# Patient Record
Sex: Female | Born: 1985 | Race: White | Hispanic: No | Marital: Married | Smoking: Never smoker
Health system: Southern US, Community
[De-identification: ages and names within clinical notes are randomized; demographics above are authoritative.]

## PROBLEM LIST (undated history)

## (undated) DIAGNOSIS — G43909 Migraine, unspecified, not intractable, without status migrainosus: Secondary | ICD-10-CM

## (undated) HISTORY — PX: UPPER GI ENDOSCOPY: SHX6162

## (undated) HISTORY — PX: TONSILLECTOMY: SUR1361

## (undated) HISTORY — PX: APPENDECTOMY: SHX54

## (undated) HISTORY — DX: Migraine, unspecified, not intractable, without status migrainosus: G43.909

---
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
## 2007-06-17 ENCOUNTER — Ambulatory Visit: Payer: Self-pay | Admitting: Family Medicine

## 2008-08-07 IMAGING — RF DG UGI W/O KUB
1 series · 9 of 9 positions shown · non-contrast
Comparison: none

REASON FOR EXAM: Severe GERD
COMMENTS:

PROCEDURE:     FL  - FL UPPER GI  - June 17, 2007  [DATE]
RESULT:     Esophageal and gastric mucosal pattern and peristaltic activity
and contour are normal. The duodenal bulb is normal.

[Series 1: run · 9 of 9 slices shown]
[im 1/9]
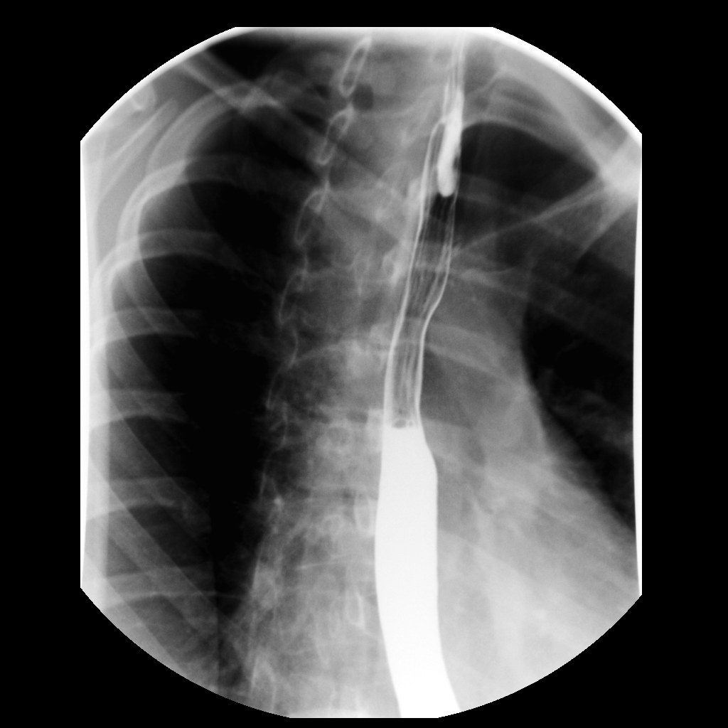
[im 2/9]
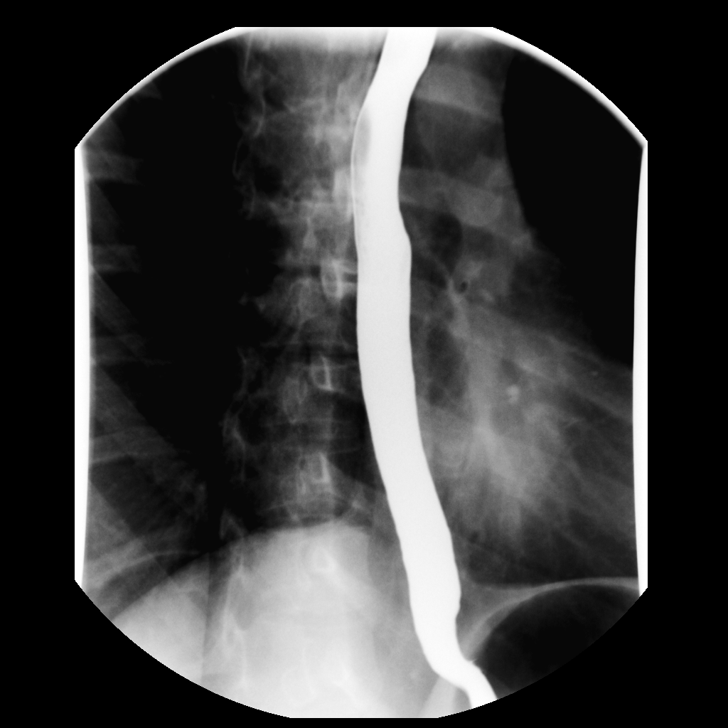
[im 3/9]
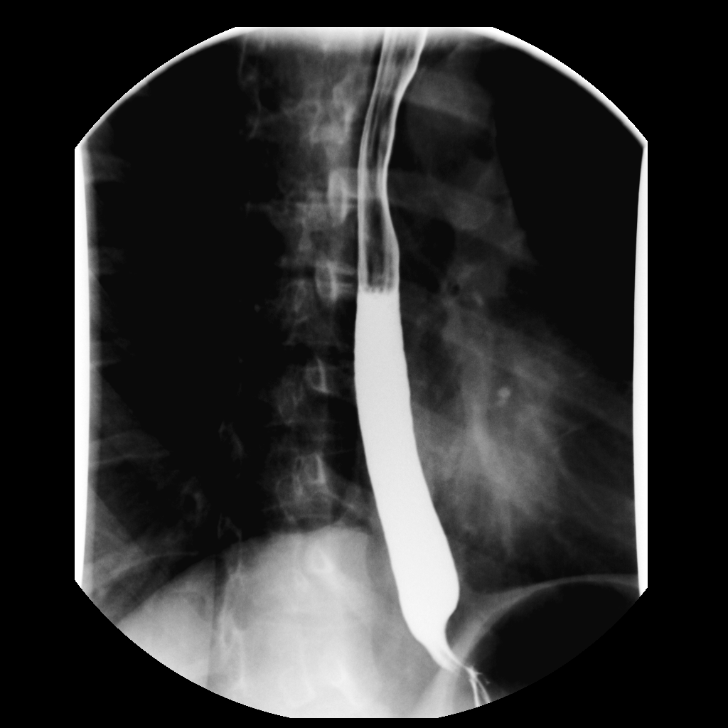
[im 4/9]
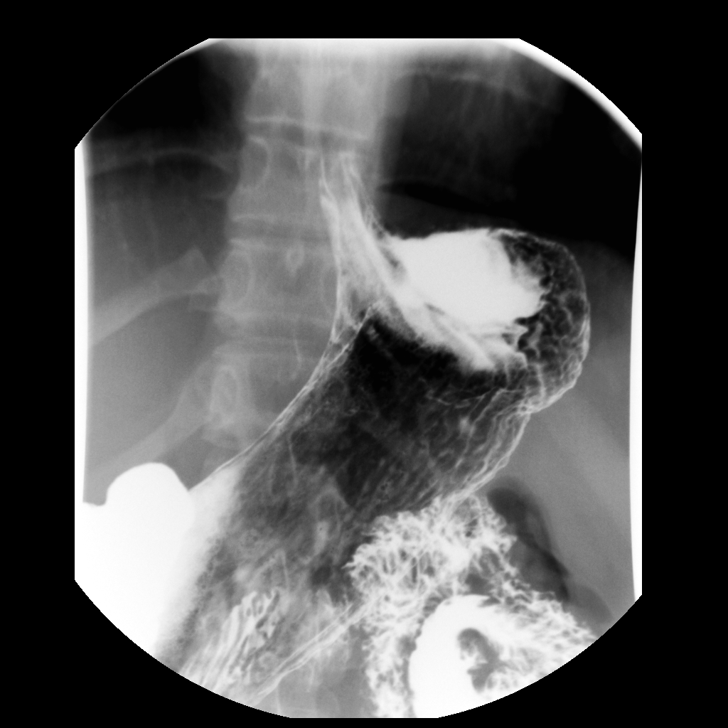
[im 5/9]
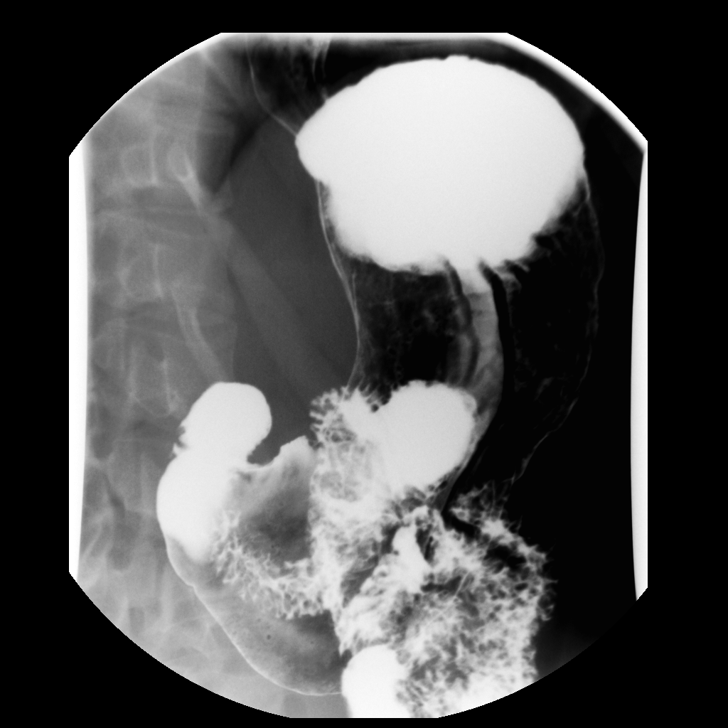
[im 6/9]
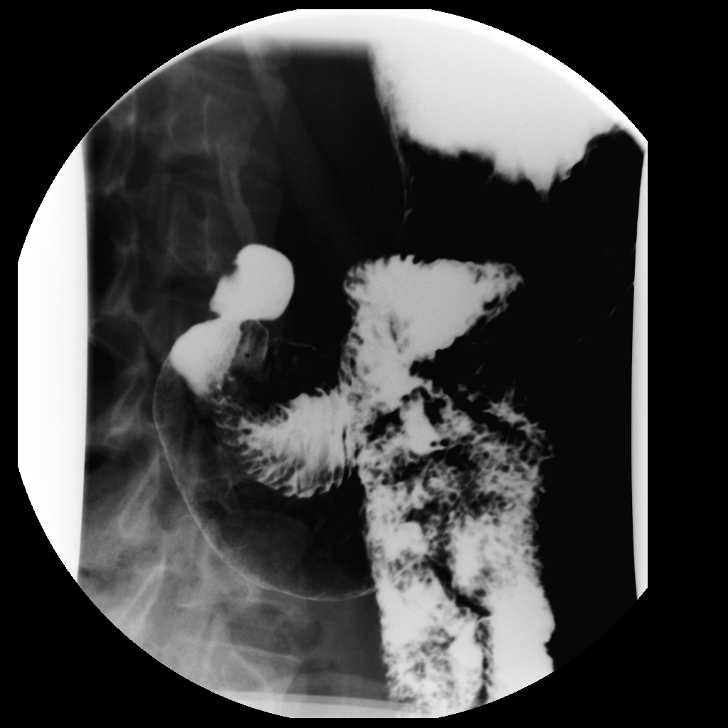
[im 7/9]
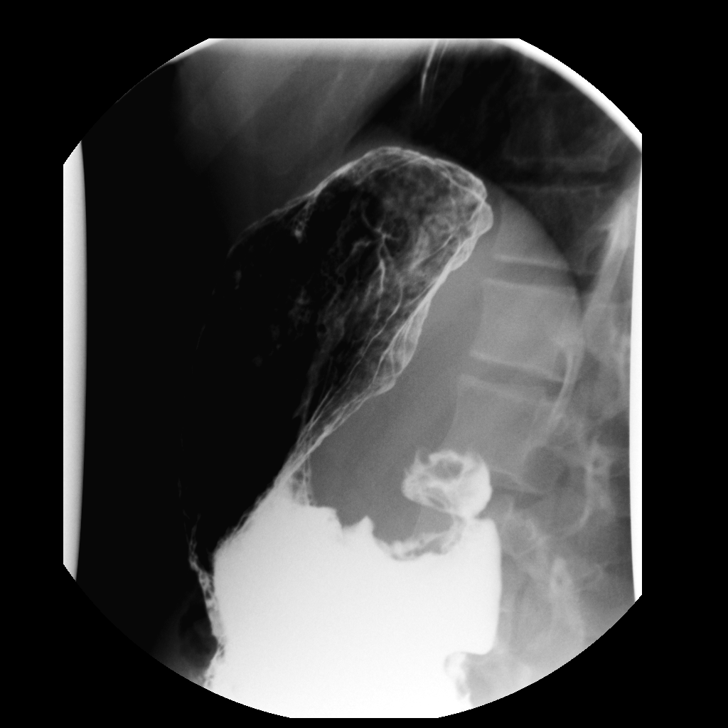
[im 8/9]
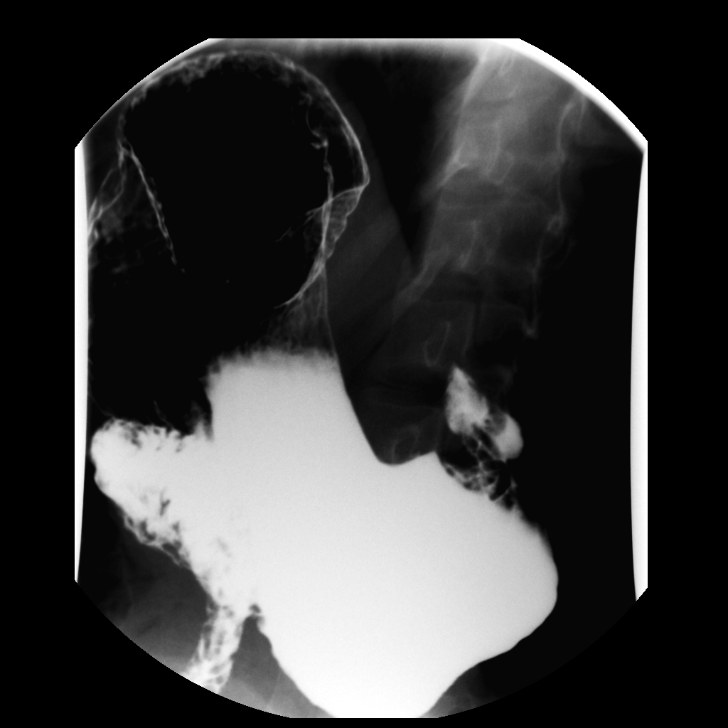
[im 9/9]
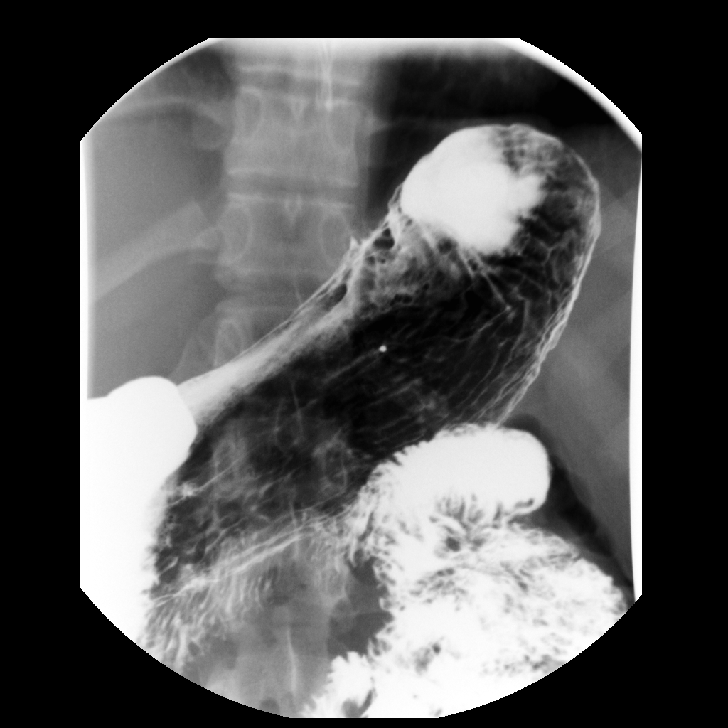

[9 of 9 positions shown; findings below may reference images not displayed]

IMPRESSION: 1)Negative exam.

## 2011-10-10 ENCOUNTER — Other Ambulatory Visit: Payer: Self-pay | Admitting: Internal Medicine

## 2011-10-10 ENCOUNTER — Other Ambulatory Visit: Payer: Self-pay | Admitting: Gastroenterology

## 2011-10-10 DIAGNOSIS — R110 Nausea: Secondary | ICD-10-CM

## 2011-10-10 DIAGNOSIS — R1013 Epigastric pain: Secondary | ICD-10-CM

## 2011-10-16 ENCOUNTER — Ambulatory Visit
Admission: RE | Admit: 2011-10-16 | Discharge: 2011-10-16 | Disposition: A | Discharge status: Home / Self Care | Payer: 59 | Source: Ambulatory Visit | Attending: Gastroenterology | Admitting: Gastroenterology

## 2011-10-16 DIAGNOSIS — R110 Nausea: Secondary | ICD-10-CM

## 2011-10-16 DIAGNOSIS — R1013 Epigastric pain: Secondary | ICD-10-CM

## 2011-11-27 HISTORY — PX: OVARIAN CYST REMOVAL: SHX89

## 2012-12-06 IMAGING — US US ABDOMEN COMPLETE
1 series · 14 of 25 positions shown · non-contrast
Comparison: None.

CLINICAL DATA: Epigastric pain and nausea

ABDOMINAL ULTRASOUND COMPLETE

[Series 1: us abdomen complete · 0.21mm/px · 14 of 64 slices shown]
[im 1/64]
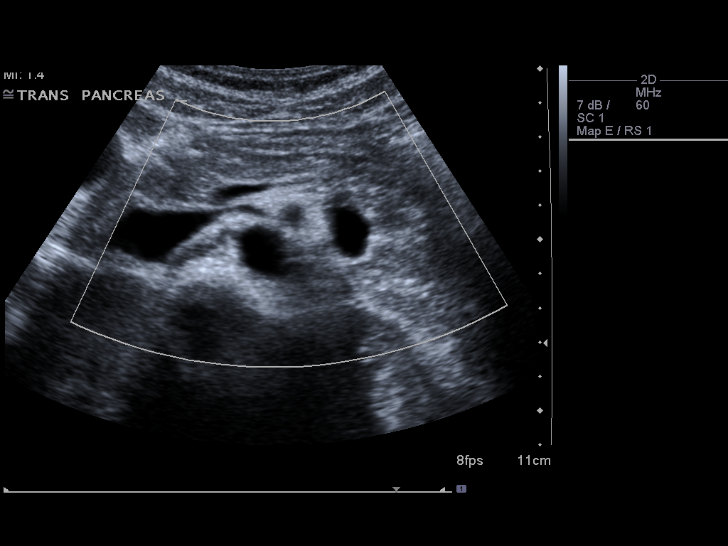
[im 6/64]
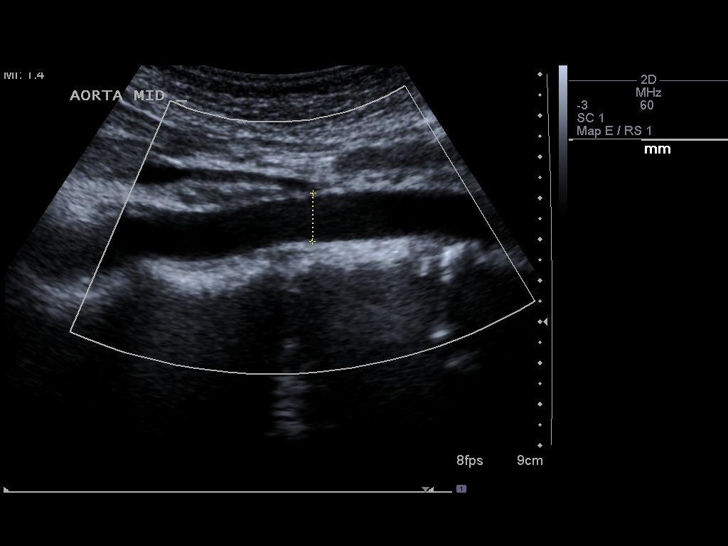
[im 11/64]
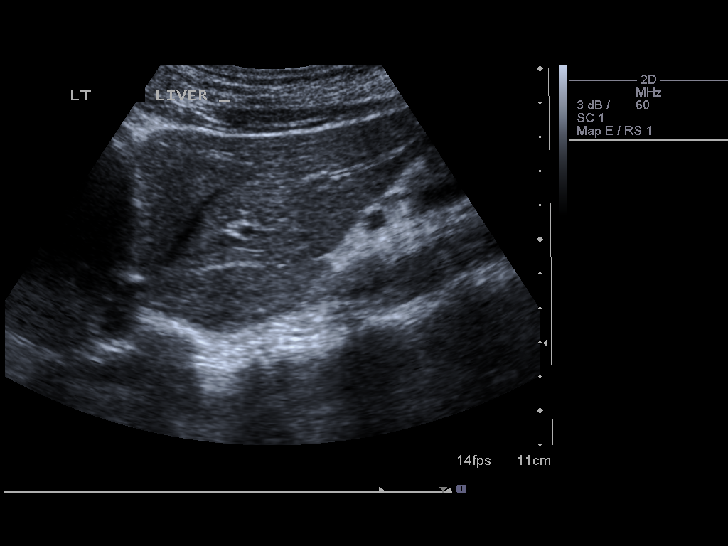
[im 16/64]
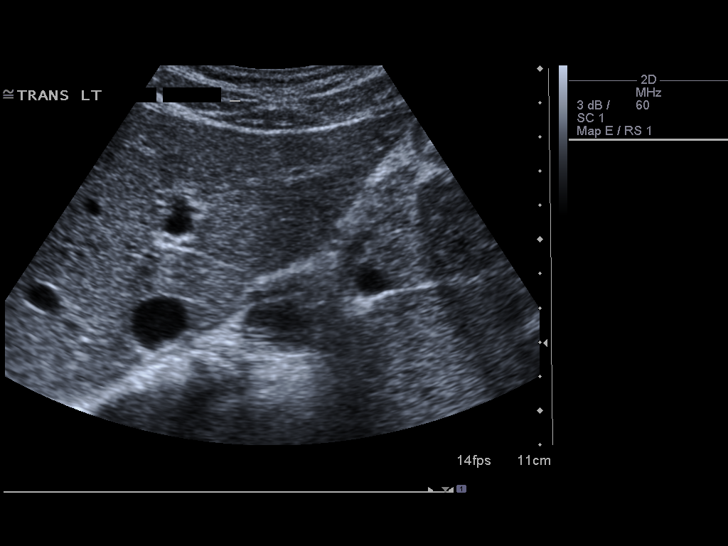
[im 22/64]
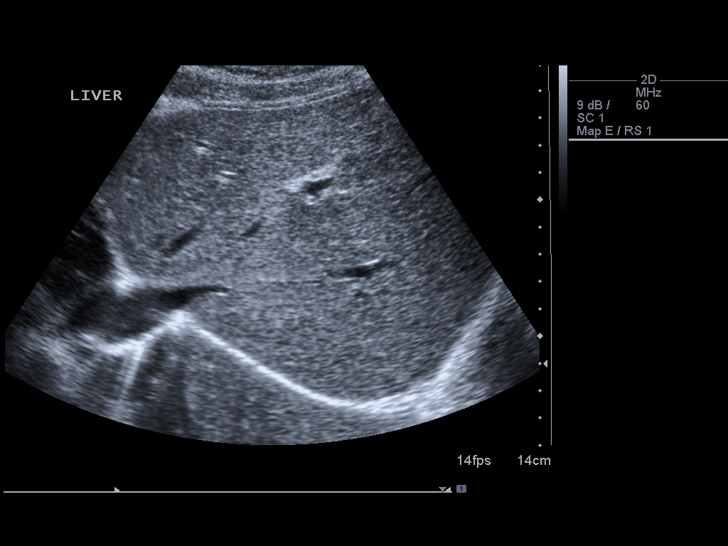
[im 24/64]
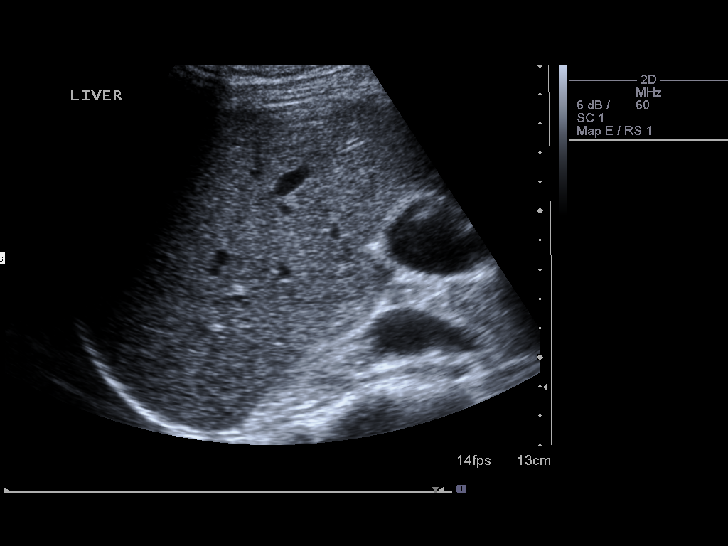
[im 29/64]
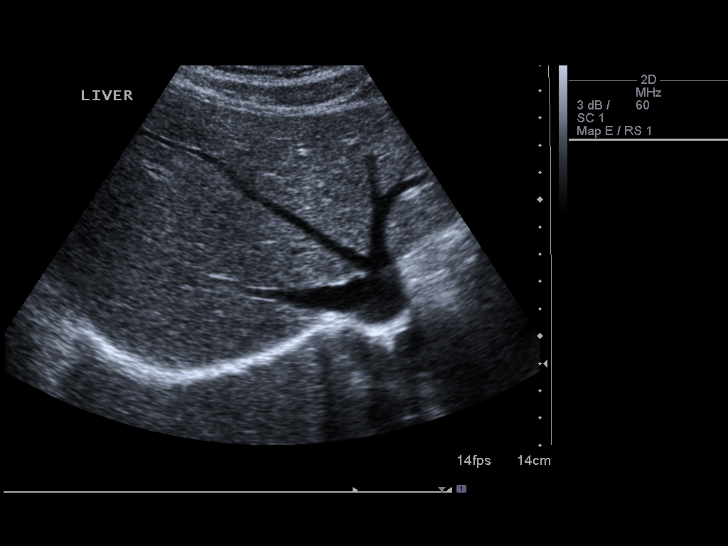
[im 35/64]
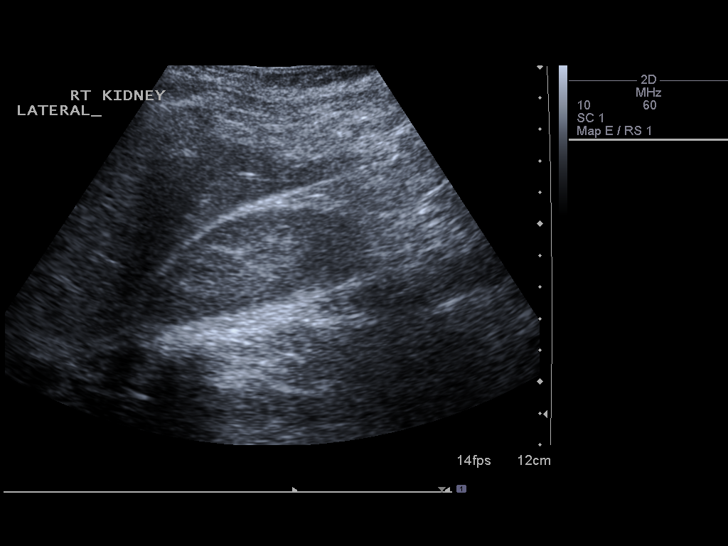
[im 40/64]
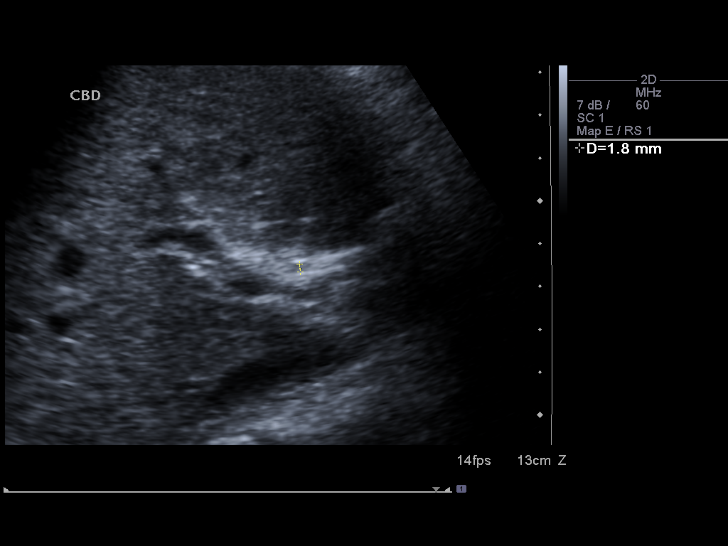
[im 43/64]
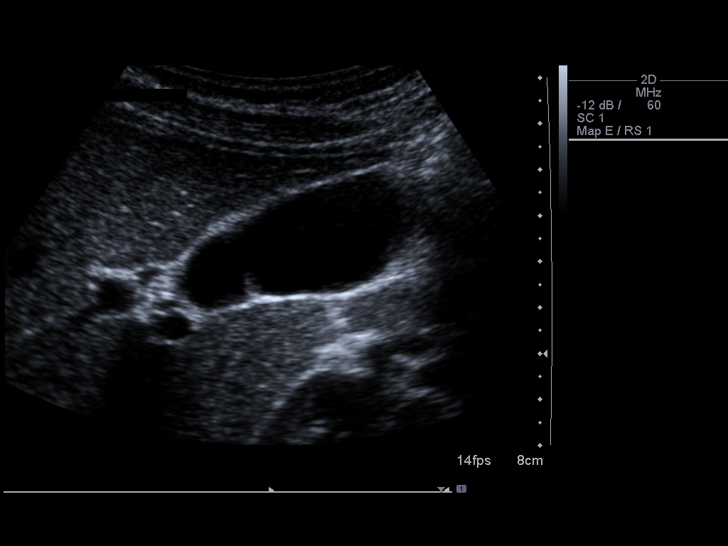
[im 48/64]
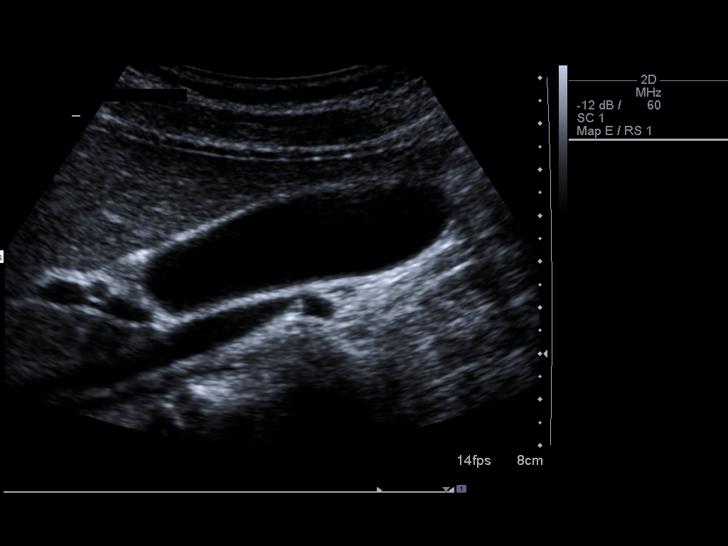
[im 53/64]
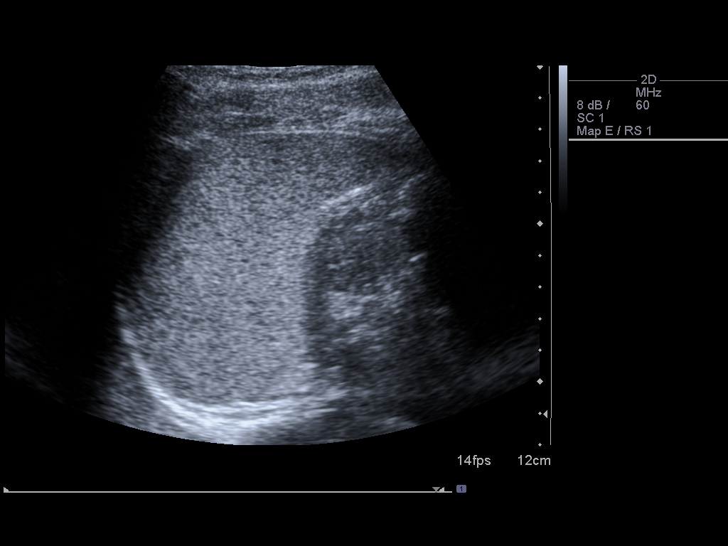
[im 58/64]
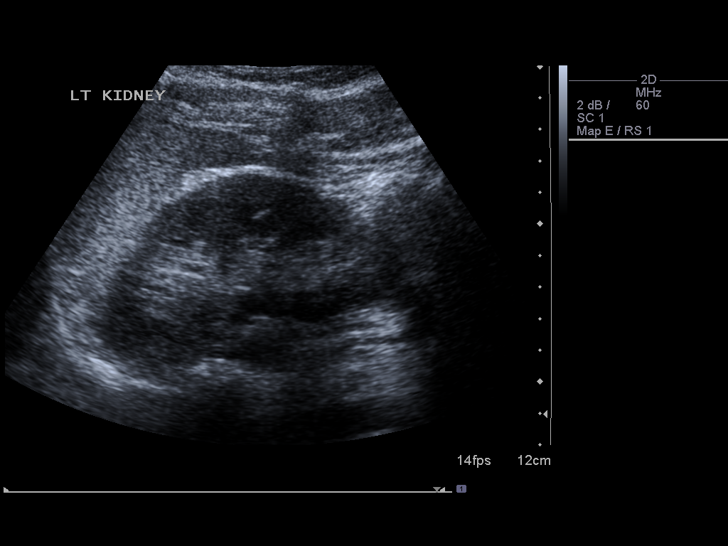
[im 64/64]
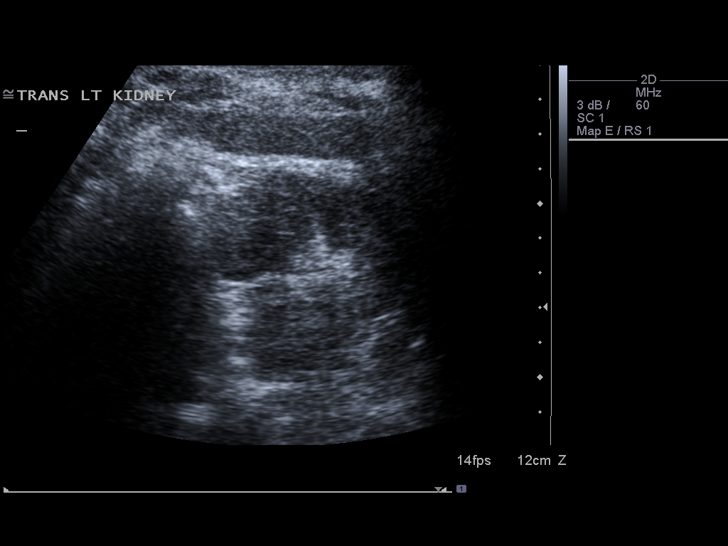

[14 of 25 positions shown; findings below may reference images not displayed]

FINDINGS: Gallbladder:  No gallstones, gallbladder wall thickening, or
pericholecystic fluid.

Common Bile Duct:  Within normal limits in caliber.

Liver: No focal mass lesion identified.  Within normal limits in
parenchymal echogenicity.

IVC:  Appears normal.

Pancreas:  No abnormality identified.

Spleen:  Within normal limits in size and echotexture.

Right kidney:  Normal in size and parenchymal echogenicity.  No
evidence of mass or hydronephrosis.

Left kidney:  Normal in size and parenchymal echogenicity.  No
evidence of mass or hydronephrosis.

Abdominal Aorta:  No aneurysm identified.
IMPRESSION: Negative abdominal ultrasound.

## 2013-11-13 LAB — BASIC METABOLIC PANEL
BUN: 8 mg/dL (ref 4–21)
Creatinine: 0.9 mg/dL (ref 0.5–1.1)
Glucose: 83 mg/dL
Potassium: 5 mmol/L (ref 3.4–5.3)
Sodium: 140 mmol/L (ref 137–147)

## 2013-11-13 LAB — HEPATIC FUNCTION PANEL
ALT: 51 U/L — AB (ref 7–35)
AST: 47 U/L — AB (ref 13–35)
Alkaline Phosphatase: 71 U/L (ref 25–125)
Bilirubin, Total: 0.2 mg/dL

## 2013-11-13 LAB — CBC AND DIFFERENTIAL
HCT: 41 % (ref 36–46)
Hemoglobin: 13.8 g/dL (ref 12.0–16.0)
Neutrophils Absolute: 3 /uL
Platelets: 223 10*3/uL (ref 150–399)
WBC: 6.3 10^3/mL

## 2015-07-06 ENCOUNTER — Other Ambulatory Visit: Payer: Self-pay | Admitting: Family Medicine

## 2016-02-06 ENCOUNTER — Other Ambulatory Visit: Payer: Self-pay | Admitting: Family Medicine

## 2016-02-25 ENCOUNTER — Other Ambulatory Visit: Payer: Self-pay | Admitting: Family Medicine

## 2016-03-05 DIAGNOSIS — N83209 Unspecified ovarian cyst, unspecified side: Secondary | ICD-10-CM | POA: Insufficient documentation

## 2016-03-05 DIAGNOSIS — G43909 Migraine, unspecified, not intractable, without status migrainosus: Secondary | ICD-10-CM | POA: Insufficient documentation

## 2016-03-05 DIAGNOSIS — K589 Irritable bowel syndrome without diarrhea: Secondary | ICD-10-CM | POA: Insufficient documentation

## 2016-03-05 DIAGNOSIS — K219 Gastro-esophageal reflux disease without esophagitis: Secondary | ICD-10-CM | POA: Insufficient documentation

## 2016-03-05 DIAGNOSIS — J45990 Exercise induced bronchospasm: Secondary | ICD-10-CM | POA: Insufficient documentation

## 2016-03-05 DIAGNOSIS — J309 Allergic rhinitis, unspecified: Secondary | ICD-10-CM | POA: Insufficient documentation

## 2016-03-12 ENCOUNTER — Ambulatory Visit: Payer: Self-pay | Admitting: Family Medicine

## 2016-03-13 ENCOUNTER — Ambulatory Visit (INDEPENDENT_AMBULATORY_CARE_PROVIDER_SITE_OTHER): Payer: BLUE CROSS/BLUE SHIELD | Admitting: Family Medicine

## 2016-03-13 VITALS — BP 122/74 | HR 64 | Temp 97.7°F | Resp 16 | Wt 157.0 lb

## 2016-03-13 DIAGNOSIS — K219 Gastro-esophageal reflux disease without esophagitis: Secondary | ICD-10-CM

## 2016-03-13 DIAGNOSIS — F419 Anxiety disorder, unspecified: Secondary | ICD-10-CM

## 2016-03-13 NOTE — Progress Notes (Signed)
Patient ID: Jessica Santana, female   DOB: [birth year], 30 y.o.   MRN: [id number]   Jessica BakesKathryn E Tarlton  MRN: [id number] DOB: [birth year]  Subjective:  HPI   The patient is a [age] year old female who presents for follow up to obtain refill on medications.  He main chronic diagnosis include migraines and GERD.   Her migraines had been doing well on preventative medication and on Botox.  She had an episode that was thought to be an allergic reaction.  She had some testing done in [place] and was told to stop several of her medications.  According to the allergist and immunologist she was allergic to the medicines.  The patient has an appointment with the allergist here in town on Friday.  She states that her GERD is doing well on the Dexilant.  She states that she occasionally has break through pain but not often. Patient is engaged to be married to a man in [place] where she now lives.   Patient Active Problem List   Diagnosis Date Noted  . Allergic rhinitis 03/05/2016  . Asthma, exercise induced 03/05/2016  . Acid reflux 03/05/2016  . 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 Headache, migraine 03/05/2016  . Cyst of ovary 03/05/2016    No past medical history on file.  Social History   Social History  . Marital Status: Single    Spouse Name: N/A  . Number of Children: N/A  . Years of Education: N/A   Occupational History  . Not on file.   Social History Main Topics  . Smoking status: Never Smoker   . Smokeless tobacco: Never Used  . Alcohol Use: No  . Drug Use: No  . Sexual Activity: Not on file   Other Topics Concern  . Not on file   Social History Narrative  . No narrative on file    Outpatient Prescriptions Prior to Visit  Medication Sig Dispense Refill  . DEXILANT 60 MG capsule TAKE 1 CAPSULE TWICE DAILY 180 capsule 1  . DULoxetine (CYMBALTA) 60 MG capsule TAKE 1 CAPSULE EVERY DAY 90 capsule 1  . levocetirizine (XYZAL) 5 MG tablet TAKE ONE TABLET EVERY DAY 90 tablet 1  .  tiZANidine (ZANAFLEX) 4 MG capsule Take by mouth.    Marland Kitchen. aspirin-acetaminophen-caffeine (EXCEDRIN MIGRAINE) 250-250-65 MG tablet Take by mouth. Reported on 03/13/2016    . atenolol (TENORMIN) 25 MG tablet Take by mouth. Reported on 03/13/2016    . busPIRone (BUSPAR) 7.5 MG tablet Take by mouth. Reported on 03/13/2016    . chlorproMAZINE (THORAZINE) 25 MG tablet Take by mouth. Reported on 03/13/2016    . eletriptan (RELPAX) 20 MG tablet Take by mouth. Reported on 03/13/2016    . gabapentin (NEURONTIN) 100 MG capsule Take by mouth. Reported on 03/13/2016    . GILDESS FE 1.5/30 1.5-30 MG-MCG tablet ONE TABLET BY MOUTH EVERY DAY (Patient not taking: Reported on 03/13/2016) 84 tablet 3  . mometasone (NASONEX) 50 MCG/ACT nasal spray Place into the nose. Reported on 03/13/2016    . Norethindrone Acetate-Ethinyl Estradiol (JUNEL 1.5/30) 1.5-30 MG-MCG tablet Take by mouth. Reported on 03/13/2016    . ondansetron (ZOFRAN) 4 MG tablet Take by mouth. Reported on 03/13/2016    . pantoprazole (PROTONIX) 40 MG tablet Take by mouth. Reported on 03/13/2016    . PROAIR HFA 108 (90 Base) MCG/ACT inhaler INHALE 2 PUFFS INTO LUNGS EVERY 4 HOURS AS NEEDED (Patient not taking: Reported on 03/13/2016) 108 g 1  . sucralfate (CARAFATE) 1  g tablet Take by mouth. Reported on 03/13/2016     No facility-administered medications prior to visit.   Outpatient Encounter Prescriptions as of 03/13/2016  Medication Sig Note  . DEXILANT 60 MG capsule TAKE 1 CAPSULE TWICE DAILY   . DULoxetine (CYMBALTA) 60 MG capsule TAKE 1 CAPSULE EVERY DAY   . levocetirizine (XYZAL) 5 MG tablet TAKE ONE TABLET EVERY DAY   . tiZANidine (ZANAFLEX) 4 MG capsule Take by mouth. 03/05/2016: Medication taken as needed.  Received from: [facility]  . aspirin-acetaminophen-caffeine (EXCEDRIN MIGRAINE) 250-250-65 MG tablet Take by mouth. Reported on 03/13/2016 03/05/2016: Medication taken as needed.  Received from: [facility]  . atenolol  (TENORMIN) 25 MG tablet Take by mouth. Reported on 03/13/2016 03/05/2016: Received from: [facility]  . busPIRone (BUSPAR) 7.5 MG tablet Take by mouth. Reported on 03/13/2016 03/05/2016: Received from: [facility]  . chlorproMAZINE (THORAZINE) 25 MG tablet Take by mouth. Reported on 03/13/2016 03/05/2016: Medication taken as needed.  Received from: [facility]  . eletriptan (RELPAX) 20 MG tablet Take by mouth. Reported on 03/13/2016 03/05/2016: Medication taken as needed.  Received from: [facility]  . gabapentin (NEURONTIN) 100 MG capsule Take by mouth. Reported on 03/13/2016 03/05/2016: Received from: [facility]  . GILDESS FE 1.5/30 1.5-30 MG-MCG tablet ONE TABLET BY MOUTH EVERY DAY (Patient not taking: Reported on 03/13/2016)   . mometasone (NASONEX) 50 MCG/ACT nasal spray Place into the nose. Reported on 03/13/2016 03/05/2016: Received from: [facility]  . Norethindrone Acetate-Ethinyl Estradiol (JUNEL 1.5/30) 1.5-30 MG-MCG tablet Take by mouth. Reported on 03/13/2016 03/05/2016: Received from: [facility]  . ondansetron (ZOFRAN) 4 MG tablet Take by mouth. Reported on 03/13/2016 03/05/2016: Medication taken as needed.  Received from: [facility]  . pantoprazole (PROTONIX) 40 MG tablet Take by mouth. Reported on 03/13/2016 03/05/2016: Received from: [facility]  . PROAIR HFA 108 (90 Base) MCG/ACT inhaler INHALE 2 PUFFS INTO LUNGS EVERY 4 HOURS AS NEEDED (Patient not taking: Reported on 03/13/2016)   . sucralfate (CARAFATE) 1 g tablet Take by mouth. Reported on 03/13/2016 03/05/2016: Received from: [facility]   No facility-administered encounter medications on file as of 03/13/2016.    Allergies  Allergen Reactions  . Barley Grass   . Clarithromycin     nausea and GI  . Eggs Or Egg-Derived Products Other (See Comments)    Pt. sts she  eats this food  . Lac Bovis Other (See Comments)    Pt. sts she drinks Milk  . Pollen Extract   . Shrimp Flavor     Review of Systems  Constitutional: Positive for malaise/fatigue. Negative for fever.  Respiratory: Negative for cough, hemoptysis, sputum production, shortness of breath and wheezing.   Cardiovascular: Negative for chest pain, palpitations, orthopnea and leg swelling.  Gastrointestinal: Positive for heartburn, nausea and abdominal pain. Negative for vomiting, diarrhea, constipation, blood in stool and melena.  Neurological: Positive for headaches. Negative for dizziness and weakness.  Psychiatric/Behavioral: Negative.        Chronic depression and anxiety presently stable.   Objective:  BP 122/74 mmHg  Pulse 64  Temp(Src) 97.7 F (36.5 C) (Oral)  Resp 16  Wt 157 lb (71.215 kg)  LMP 02/28/2016 (Exact Date)  Physical Exam  Constitutional: She is oriented to person, place, and time and well-developed, well-nourished, and in no distress.  HENT:  Head: Normocephalic and atraumatic.  Left Ear: External ear normal.  Nose: Nose  normal.  Eyes: Conjunctivae are normal. Pupils are equal, round, and reactive to light.  Neck: Normal range of motion.  Cardiovascular: Normal rate, regular rhythm and normal heart sounds.   Pulmonary/Chest: Effort normal and breath sounds normal.  Abdominal: Soft.  Neurological: She is alert and oriented to person, place, and time. Gait normal.  Skin: Skin is warm and dry.  Psychiatric: Mood, memory, affect and judgment normal.    Assessment and Plan :  No diagnosis found. MDD/GAD stable GERD IBS All these issues presently stable.continue present medication but there is some question as to whether she might be allergic to some of these medications. She sees Dr. Ernestine Mcmurray next week for this. If he cannot help her may refer her to Dr. [place] Callas in the future. I have done the exam and reviewed the above chart and it is accurate to the best of my  knowledge.  Julieanne Manson MD [facility] 03/13/2016 4:13 PM

## 2016-03-13 NOTE — Patient Instructions (Signed)
Patient is to try and decrease Dexilant to once a day.  If her symptoms increase she is to try adding OTC Ranitidine once per day with the Dexilant.  If the symptoms persist with that regiment gop back to Dexilant twice daily.

## 2016-03-20 DIAGNOSIS — M791 Myalgia: Secondary | ICD-10-CM | POA: Diagnosis not present

## 2016-03-20 DIAGNOSIS — G43019 Migraine without aura, intractable, without status migrainosus: Secondary | ICD-10-CM | POA: Diagnosis not present

## 2016-03-20 DIAGNOSIS — G518 Other disorders of facial nerve: Secondary | ICD-10-CM | POA: Diagnosis not present

## 2016-03-20 DIAGNOSIS — R51 Headache: Secondary | ICD-10-CM | POA: Diagnosis not present

## 2016-03-20 DIAGNOSIS — M542 Cervicalgia: Secondary | ICD-10-CM | POA: Diagnosis not present

## 2016-03-20 DIAGNOSIS — G43719 Chronic migraine without aura, intractable, without status migrainosus: Secondary | ICD-10-CM | POA: Diagnosis not present

## 2016-04-09 DIAGNOSIS — G43719 Chronic migraine without aura, intractable, without status migrainosus: Secondary | ICD-10-CM | POA: Diagnosis not present

## 2016-04-10 ENCOUNTER — Other Ambulatory Visit: Payer: Self-pay | Admitting: Family Medicine

## 2016-04-10 ENCOUNTER — Encounter: Payer: Self-pay | Admitting: Family Medicine

## 2016-04-10 NOTE — Telephone Encounter (Signed)
Ok to rf for 1 year. 

## 2016-04-10 NOTE — Telephone Encounter (Signed)
Please review-aa 

## 2016-04-10 NOTE — Telephone Encounter (Signed)
Pt contacted office for refill request on the following medications:  6 month supply.  [facility].  CB#[phone]/MW   levocetirizine (XYZAL) 5 MG tablet    DULoxetine (CYMBALTA) 60 MG capsule

## 2016-04-11 MED ORDER — LEVOCETIRIZINE DIHYDROCHLORIDE 5 MG PO TABS: 5.0000 mg | ORAL_TABLET | Freq: Every day | ORAL | Status: DC

## 2016-04-11 MED ORDER — DULOXETINE HCL 60 MG PO CPEP: 60.0000 mg | ORAL_CAPSULE | Freq: Every day | ORAL | Status: DC

## 2016-04-11 NOTE — Telephone Encounter (Signed)
Done-aa 

## 2016-04-12 DIAGNOSIS — G43719 Chronic migraine without aura, intractable, without status migrainosus: Secondary | ICD-10-CM | POA: Diagnosis not present

## 2016-04-12 DIAGNOSIS — G43019 Migraine without aura, intractable, without status migrainosus: Secondary | ICD-10-CM | POA: Diagnosis not present

## 2016-07-16 DIAGNOSIS — G43719 Chronic migraine without aura, intractable, without status migrainosus: Secondary | ICD-10-CM | POA: Diagnosis not present

## 2016-08-20 DIAGNOSIS — G43019 Migraine without aura, intractable, without status migrainosus: Secondary | ICD-10-CM | POA: Diagnosis not present

## 2016-08-20 DIAGNOSIS — G43719 Chronic migraine without aura, intractable, without status migrainosus: Secondary | ICD-10-CM | POA: Diagnosis not present

## 2016-08-22 ENCOUNTER — Other Ambulatory Visit: Payer: Self-pay | Admitting: Family Medicine

## 2016-10-05 ENCOUNTER — Other Ambulatory Visit: Payer: Self-pay | Admitting: Family Medicine

## 2016-10-23 DIAGNOSIS — G43719 Chronic migraine without aura, intractable, without status migrainosus: Secondary | ICD-10-CM | POA: Diagnosis not present

## 2016-10-23 DIAGNOSIS — G43019 Migraine without aura, intractable, without status migrainosus: Secondary | ICD-10-CM | POA: Diagnosis not present

## 2016-11-02 DIAGNOSIS — G43719 Chronic migraine without aura, intractable, without status migrainosus: Secondary | ICD-10-CM | POA: Diagnosis not present

## 2016-11-12 DIAGNOSIS — G43019 Migraine without aura, intractable, without status migrainosus: Secondary | ICD-10-CM | POA: Diagnosis not present

## 2016-11-12 DIAGNOSIS — G43719 Chronic migraine without aura, intractable, without status migrainosus: Secondary | ICD-10-CM | POA: Diagnosis not present

## 2017-01-16 ENCOUNTER — Telehealth: Payer: Self-pay | Admitting: Family Medicine

## 2017-01-16 NOTE — Telephone Encounter (Signed)
Pt request a call back from [name] for an update on her PA for  DEXILANT 60 MG capsule. Pt stated that her pharmacy has already faxed the PA forms to our office. Please advise. Thanks TNP

## 2017-01-17 ENCOUNTER — Other Ambulatory Visit: Payer: Self-pay

## 2017-01-17 NOTE — Telephone Encounter (Signed)
I will call patient today. I have done the PA and it was denied-aa

## 2017-01-17 NOTE — Telephone Encounter (Signed)
Pt advised. Patient may need samples of Dexilant later depending how long it will take for insurance to respond-aa

## 2017-01-17 NOTE — Telephone Encounter (Signed)
Spoke with pharmacist at [facility] generic Dexilant is not available. Called insurance and we have to do an appeal. Letter wrote and faxed to insurance, per representative from insurance he said it can take up to 60 days I think to hear back. Left message for patient to call back to let her know and called pharmacy back and advised them of the process we are taking for the medication-aa

## 2017-01-22 ENCOUNTER — Other Ambulatory Visit: Payer: Self-pay

## 2017-01-22 DIAGNOSIS — Z308 Encounter for other contraceptive management: Secondary | ICD-10-CM

## 2017-03-11 DIAGNOSIS — G43719 Chronic migraine without aura, intractable, without status migrainosus: Secondary | ICD-10-CM | POA: Diagnosis not present

## 2017-03-19 ENCOUNTER — Ambulatory Visit (INDEPENDENT_AMBULATORY_CARE_PROVIDER_SITE_OTHER): Payer: BLUE CROSS/BLUE SHIELD | Admitting: Obstetrics and Gynecology

## 2017-03-19 ENCOUNTER — Telehealth: Payer: Self-pay | Admitting: Obstetrics and Gynecology

## 2017-03-19 ENCOUNTER — Encounter: Payer: Self-pay | Admitting: Obstetrics and Gynecology

## 2017-03-19 VITALS — BP 116/74 | HR 108 | Ht 63.0 in | Wt 148.6 lb

## 2017-03-19 DIAGNOSIS — Z30430 Encounter for insertion of intrauterine contraceptive device: Secondary | ICD-10-CM | POA: Diagnosis not present

## 2017-03-19 DIAGNOSIS — Z3009 Encounter for other general counseling and advice on contraception: Secondary | ICD-10-CM

## 2017-03-19 DIAGNOSIS — Z124 Encounter for screening for malignant neoplasm of cervix: Secondary | ICD-10-CM

## 2017-03-19 DIAGNOSIS — N920 Excessive and frequent menstruation with regular cycle: Secondary | ICD-10-CM

## 2017-03-19 LAB — POCT URINE PREGNANCY: Preg Test, Ur: NEGATIVE

## 2017-03-19 MED ORDER — MEDROXYPROGESTERONE ACETATE 10 MG PO TABS: ORAL_TABLET | ORAL | 0 refills | Status: DC

## 2017-03-19 NOTE — Telephone Encounter (Signed)
Patient asked about medication that was to be sent to pharmacy for heavy bleeding if needed Please call patient with instruction for use also  [facility]

## 2017-03-19 NOTE — Progress Notes (Signed)
GYNECOLOGY CLINIC PROGRESS NOTE  Subjective:    Jessica Santana is a 31 y.o. G0P0000 female who presents for contraception counseling. Is considering a ParaGard IUD.The patient has no complaints today. The patient is sexually active. Pertinent past medical history: migraines.  She discontinued combined OCPs in January due to worsening of migraines.     Menstrual History: OB History    Gravida Para Term Preterm AB Living   0 0 0 0 0 0   SAB TAB Ectopic Multiple Live Births   0 0 0 0 0      Menarche age: 75 Patient's last menstrual period was 02/10/2017 (approximate).  Patient reports a history of irregular menses (do not always come monthly). Cycles when they do occur can last anywhere from 5 days to 20 days.     Past Medical History:  Diagnosis Date  . Migraine     Family History  Problem Relation Age of Onset  . Cancer Mother     breast  . Diabetes Father     Past Surgical History:  Procedure Laterality Date  . APPENDECTOMY    . OVARIAN CYST REMOVAL  2013  . TONSILLECTOMY    . UPPER GI ENDOSCOPY     11/05/11 normal    Social History   Social History  . Marital status: Single    Spouse name: N/A  . Number of children: N/A  . Years of education: N/A   Occupational History  . Not on file.   Social History Main Topics  . Smoking status: Never Smoker  . Smokeless tobacco: Never Used  . Alcohol use No  . Drug use: No  . Sexual activity: Yes    Birth control/ protection: None   Other Topics Concern  . Not on file   Social History Narrative  . No narrative on file    Current Outpatient Prescriptions on File Prior to Visit  Medication Sig Dispense Refill  . DULoxetine (CYMBALTA) 60 MG capsule Take 1 capsule (60 mg total) by mouth daily. 90 capsule 3  . levocetirizine (XYZAL) 5 MG tablet Take 1 tablet (5 mg total) by mouth daily. 90 tablet 3  . PROAIR HFA 108 (90 Base) MCG/ACT inhaler INHALE 2 PUFFS INTO LUNGS EVERY 4 HOURS AS NEEDED 108 g 1  .  tiZANidine (ZANAFLEX) 4 MG capsule Take by mouth.    . chlorproMAZINE (THORAZINE) 25 MG tablet Take by mouth. Reported on 03/13/2016     No current facility-administered medications on file prior to visit.     Allergies  Allergen Reactions  . Lamictal [Lamotrigine] Anaphylaxis  . Barley Grass   . Clarithromycin     nausea and GI  . Eggs Or Egg-Derived Products Other (See Comments)    Pt. sts she eats this food  . Lac Bovis Other (See Comments)    Pt. sts she drinks Milk  . Pollen Extract   . Shrimp Flavor      Review of Systems Pertinent items noted in HPI and remainder of comprehensive ROS otherwise negative. Patient reports self treating with OTC meds for recent yeast infection.   Objective:    BP 116/74 (BP Location: Left Arm, Patient Position: Sitting, Cuff Size: Normal)   Pulse (!) 108   Ht  (1.6 m)   Wt 148 lb 9.6 oz (67.4 kg)   LMP 02/10/2017 (Approximate)   BMI 26.32 kg/m  General appearance: alert and no distress Abdomen: soft, non-tender; bowel sounds normal; no masses,  no organomegaly  Pelvic: external genitalia normal, rectovaginal septum normal.  Vagina without discharge.  Cervix normal appearing, no lesions and no motion tenderness.  Uterus mobile, nontender, normal shape and size.  Adnexae non-palpable, nontender bilaterally.  Extremities: extremities normal, atraumatic, no cyanosis or edema Neurologic: Grossly normal   Assessment:    31 y.o., desiring ParaGard IUD, no contraindications.  H/o migraines Irregular menses Cervical cancer screening  Plan:   - Reviewed all forms of birth control options available including abstinence; over the counter/barrier methods; hormonal contraceptive medication including progesterone pill,, injection,contraceptive implant; hormonal and nonhormonal IUDs.  Patient not a candidate for estrogen-containing products due to history of it worsening migraines.  Discussed that a hormonal method may help not only with  contraception but can help to regulate or even eliminate cycles.  Patient declines, still desires to use hormone free method with ParGard.  Desires insertion today.  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 See insertion note below.  - Discussed alternative management of irregular prolonged menses with ParaGard IUD place.  Will prescribe Provera tablets for use if periods become prolonged.  - Patient reports last pap smear last year while in [place]. Notes she was told she would need to repeat this year (however does not recall if last pap smear was abnormal).  Will repeat today prior to insertion of IUD.    IUD Insertion Procedure Note Patient identified, informed consent performed, consent signed.   Discussed risks of irregular bleeding, cramping, infection, malpositioning or misplacement of the IUD outside the uterus which may require further procedure such as laparoscopy. Time out was performed.  Urine pregnancy test negative.  Speculum placed in the vagina.  Cervix visualized.  Cleaned with Betadine x 2.  Grasped anteriorly with a single tooth tenaculum.  Uterus sounded to 9 cm.  ParaGard IUD placed per manufacturer's recommendations.  Strings trimmed to 3 cm. Tenaculum was removed, good hemostasis noted.  Patient tolerated procedure well.   Patient was given post-procedure instructions.  She was advised to have backup contraception for two weeks.  Patient was also asked to check IUD strings periodically and follow up in 4 weeks for IUD check.   Hildred Laser, MD [facility]

## 2017-03-19 NOTE — Patient Instructions (Signed)

## 2017-03-19 NOTE — Telephone Encounter (Signed)
RX sent in

## 2017-03-21 DIAGNOSIS — G43019 Migraine without aura, intractable, without status migrainosus: Secondary | ICD-10-CM | POA: Diagnosis not present

## 2017-03-21 DIAGNOSIS — G43719 Chronic migraine without aura, intractable, without status migrainosus: Secondary | ICD-10-CM | POA: Diagnosis not present

## 2017-03-22 LAB — PAP IG, CT-NG, RFX HPV ASCU
Chlamydia, Nuc. Acid Amp: NEGATIVE
Gonococcus by Nucleic Acid Amp: NEGATIVE
PAP Smear Comment: 0

## 2017-06-14 ENCOUNTER — Other Ambulatory Visit: Payer: Self-pay | Admitting: Family Medicine

## 2017-07-02 ENCOUNTER — Other Ambulatory Visit: Payer: Self-pay | Admitting: Family Medicine

## 2017-07-02 ENCOUNTER — Other Ambulatory Visit: Payer: Self-pay

## 2017-07-02 DIAGNOSIS — G43719 Chronic migraine without aura, intractable, without status migrainosus: Secondary | ICD-10-CM | POA: Diagnosis not present

## 2017-07-02 DIAGNOSIS — G43019 Migraine without aura, intractable, without status migrainosus: Secondary | ICD-10-CM | POA: Diagnosis not present

## 2017-07-02 MED ORDER — LEVOCETIRIZINE DIHYDROCHLORIDE 5 MG PO TABS: 5.0000 mg | ORAL_TABLET | Freq: Every day | ORAL | 0 refills | Status: DC

## 2017-07-02 MED ORDER — DULOXETINE HCL 60 MG PO CPEP: 60.0000 mg | ORAL_CAPSULE | Freq: Every day | ORAL | 0 refills | Status: DC

## 2017-07-02 NOTE — Telephone Encounter (Signed)
Needs OV. Gave 30 days.

## 2017-07-02 NOTE — Telephone Encounter (Signed)
Please review-aa 

## 2017-07-04 ENCOUNTER — Other Ambulatory Visit: Payer: Self-pay | Admitting: Family Medicine

## 2017-07-04 ENCOUNTER — Other Ambulatory Visit: Payer: Self-pay | Admitting: Emergency Medicine

## 2017-07-04 MED ORDER — PANTOPRAZOLE SODIUM 40 MG PO TBEC: 40.0000 mg | DELAYED_RELEASE_TABLET | ORAL | 11 refills | Status: DC

## 2017-07-05 ENCOUNTER — Encounter: Payer: Self-pay | Admitting: Obstetrics and Gynecology

## 2017-07-05 ENCOUNTER — Ambulatory Visit (INDEPENDENT_AMBULATORY_CARE_PROVIDER_SITE_OTHER): Payer: BLUE CROSS/BLUE SHIELD | Admitting: Obstetrics and Gynecology

## 2017-07-05 VITALS — BP 94/62 | HR 88 | Ht 63.0 in | Wt 144.1 lb

## 2017-07-05 DIAGNOSIS — N760 Acute vaginitis: Secondary | ICD-10-CM

## 2017-07-05 MED ORDER — HYLAFEM VA SUPP: 1.0000 | Freq: Every day | VAGINAL | 0 refills | Status: AC

## 2017-07-05 NOTE — Progress Notes (Signed)
    GYNECOLOGY CLINIC PROGRESS NOTE Subjective:     Jessica Santana is a 31 y.o. G0P0000 female who presents for evaluation of an abnormal vaginal discharge. Symptoms have been present for 2 days. Vaginal symptoms: discharge described as white and thin and local irritation. Contraception: ParaGard IUD. She denies lesions, odor and urinary symptoms of dysuria, lower abdominal pain and urinary frequency Sexually transmitted infection risk: very low risk of STD exposure. Menstrual flow: regular every 28-30 days.  Of note, patient notes history of recurrent vaginitis over the past 6-9 months, with at least 5-6 infections (alternating yeast and BV).  She has been in [place] for most of the year on mission trips.  Has been treated several times there with several different antibiotics.  Leaves for [place] again tomorrow.   The following portions of the patient's history were reviewed and updated as appropriate: allergies, current medications, past family history, past medical history, past social history, past surgical history and problem list.   Review of Systems Pertinent items noted in HPI and remainder of comprehensive ROS otherwise negative.    Objective:    BP 94/62 (BP Location: Left Arm, Patient Position: Sitting, Cuff Size: Normal)   Pulse 88   Ht 5\' 3"  (1.6 m)   Wt 144 lb 1.6 oz (65.4 kg)   LMP 06/21/2017   BMI 25.53 kg/m  General appearance: alert and no distress Abdomen: soft, non-tender; bowel sounds normal; no masses,  no organomegaly Pelvic: external genitalia normal, rectovaginal septum normal.  Vagina with scant thin white discharge, no odor.  Cervix normal appearing, no lesions and no motion tenderness.  Uterus mobile, nontender, normal shape and size.  Adnexae non-palpable, nontender bilaterally.    Microscopic wet-mount exam shows negative for pathogens, few clue cells, normal epithelial cells, KOH done.     Assessment:   Recurrent vaginitis  Plan:   Patient  currently without infection, but with local irritation symptoms.  Does have a h/o recurrent vaginitis.  Discussed preventative measures, including eating a healthy diet rich in fruits and vegetables and low in sugar and refined carbohydrates, supporting her immune system by reducing stress, getting adequate sleep and exercise regularly, eating plain, probiotic yogurt daily or take a daily vaginal probiotic, avoiding harsh soaps, and detergents.  Can use vaginal washes like Vagisil or Summer's Eve. Also avoiding routine douching, and if she notices that sex can trigger infections for her, to use condoms.   Also given samples of Clindesse, a prescription for Hylafem to take with her back to [place] to help with yeast infections and non-infectious vaginitis to restore the pH.  Can buy Monistat OTC prior to traveling if needed.    Hildred Laserherry, Kort Stettler, MD [facility] 07/07/2017 7:47 PM

## 2017-07-05 NOTE — Patient Instructions (Addendum)
PREVENTION OF RECURRENT VAGINITIS:   1. First and foremost, eat a healthy diet rich in fruits and vegetables and low in sugar and refined carbohydrates. Studies have shown that yeast and harmful bacteria are more likely to be a problem in people with high sugar diets. One food believed to be particularly helpful is garlic. 2. Support your immune system: reduce stress, get adequate sleep and exercise regularly. 3. Eat plain, probiotic yogurt daily or take a daily vaginal probiotic (can be found in the feminine aisle at any pharmacy) containing Acidophillus (they should contain at least 1 billion CFU's- this is information found on the bottle).  4. Avoid harsh soaps, and detergents.  Can use vaginal washes like Vagisil or Summer's Eve.  5.Avoid routine douching, and if you notice that sex can trigger infections for you, use condoms. For some women who get infections easily, both douching and the alkaline nature of semen can disrupt the vaginal pH.  

## 2017-11-13 DIAGNOSIS — G43019 Migraine without aura, intractable, without status migrainosus: Secondary | ICD-10-CM | POA: Diagnosis not present

## 2017-11-13 DIAGNOSIS — G43719 Chronic migraine without aura, intractable, without status migrainosus: Secondary | ICD-10-CM | POA: Diagnosis not present

## 2017-11-14 DIAGNOSIS — F4321 Adjustment disorder with depressed mood: Secondary | ICD-10-CM | POA: Diagnosis not present

## 2017-12-13 ENCOUNTER — Other Ambulatory Visit: Payer: Self-pay | Admitting: Family Medicine

## 2018-05-03 DIAGNOSIS — H6691 Otitis media, unspecified, right ear: Secondary | ICD-10-CM | POA: Diagnosis not present

## 2018-05-05 DIAGNOSIS — G43719 Chronic migraine without aura, intractable, without status migrainosus: Secondary | ICD-10-CM | POA: Diagnosis not present

## 2018-05-05 DIAGNOSIS — M542 Cervicalgia: Secondary | ICD-10-CM | POA: Diagnosis not present

## 2018-05-05 DIAGNOSIS — G518 Other disorders of facial nerve: Secondary | ICD-10-CM | POA: Diagnosis not present

## 2018-05-05 DIAGNOSIS — M791 Myalgia, unspecified site: Secondary | ICD-10-CM | POA: Diagnosis not present

## 2018-05-05 DIAGNOSIS — G43019 Migraine without aura, intractable, without status migrainosus: Secondary | ICD-10-CM | POA: Diagnosis not present

## 2018-05-06 ENCOUNTER — Ambulatory Visit: Payer: BLUE CROSS/BLUE SHIELD | Admitting: Obstetrics and Gynecology

## 2018-05-06 ENCOUNTER — Encounter: Payer: Self-pay | Admitting: Obstetrics and Gynecology

## 2018-05-06 VITALS — BP 98/63 | HR 94 | Ht 63.0 in | Wt 141.9 lb

## 2018-05-06 DIAGNOSIS — N926 Irregular menstruation, unspecified: Secondary | ICD-10-CM

## 2018-05-06 DIAGNOSIS — Z3169 Encounter for other general counseling and advice on procreation: Secondary | ICD-10-CM

## 2018-05-06 DIAGNOSIS — N83202 Unspecified ovarian cyst, left side: Secondary | ICD-10-CM | POA: Diagnosis not present

## 2018-05-06 DIAGNOSIS — N898 Other specified noninflammatory disorders of vagina: Secondary | ICD-10-CM

## 2018-05-06 DIAGNOSIS — N83201 Unspecified ovarian cyst, right side: Secondary | ICD-10-CM | POA: Diagnosis not present

## 2018-05-06 NOTE — Progress Notes (Signed)
Pt is here for medication check and fertitly questions. Pt stated that she wants to know what medications she should not take during pregnancy. Pt stated that she had some irregular cycles that caused her to get an u/s done and they noticed cyst on her ovaries.

## 2018-05-06 NOTE — Patient Instructions (Signed)
Preparing for Pregnancy If you are considering becoming pregnant, make an appointment to see your regular health care provider to learn how to prepare for a safe and healthy pregnancy (preconception care). During a preconception care visit, your health care provider will:  Do a complete physical exam, including a Pap test.  Take a complete medical history.  Give you information, answer your questions, and help you resolve problems.  Preconception checklist Medical history  Tell your health care provider about any current or past medical conditions. Your pregnancy or your ability to become pregnant may be affected by chronic conditions, such as diabetes, chronic hypertension, and thyroid problems.  Include your family's medical history as well as your partner's medical history.  Tell your health care provider about any history of STIs (sexually transmitted infections).These can affect your pregnancy. In some cases, they can be passed to your baby. Discuss any concerns that you have about STIs.  If indicated, discuss the benefits of genetic testing. This testing will show whether there are any genetic conditions that may be passed from you or your partner to your baby.  Tell your health care provider about: ? Any problems you have had with conception or pregnancy. ? Any medicines you take. These include vitamins, herbal supplements, and over-the-counter medicines. ? Your history of immunizations. Discuss any vaccinations that you may need.  Diet  Ask your health care provider what to include in a healthy diet that has a balance of nutrients. This is especially important when you are pregnant or preparing to become pregnant.  Ask your health care provider to help you reach a healthy weight before pregnancy. ? If you are overweight, you may be at higher risk for certain complications, such as high blood pressure, diabetes, and preterm birth. ? If you are underweight, you are more likely  to have a baby who has a low birth weight.  Lifestyle, work, and home  Let your health care provider know: ? About any lifestyle habits that you have, such as alcohol use, drug use, or smoking. ? About recreational activities that may put you at risk during pregnancy, such as downhill skiing and certain exercise programs. ? Tell your health care provider about any international travel, especially any travel to places with an active Zika virus outbreak. ? About harmful substances that you may be exposed to at work or at home. These include chemicals, pesticides, radiation, or even litter boxes. ? If you do not feel safe at home.  Mental health  Tell your health care provider about: ? Any history of mental health conditions, including feelings of depression, sadness, or anxiety. ? Any medicines that you take for a mental health condition. These include herbs and supplements.  Home instructions to prepare for pregnancy Lifestyle  Eat a balanced diet. This includes fresh fruits and vegetables, whole grains, lean meats, low-fat dairy products, healthy fats, and foods that are high in fiber. Ask to meet with a nutritionist or registered dietitian for assistance with meal planning and goals.  Get regular exercise. Try to be active for at least 30 minutes a day on most days of the week. Ask your health care provider which activities are safe during pregnancy.  Do not use any products that contain nicotine or tobacco, such as cigarettes and e-cigarettes. If you need help quitting, ask your health care provider.  Do not drink alcohol.  Do not take illegal drugs.  Maintain a healthy weight. Ask your health care provider what weight range is   right for you.  General instructions  Keep an accurate record of your menstrual periods. This makes it easier for your health care provider to determine your baby's due date.  Begin taking prenatal vitamins and folic acid supplements daily as directed by  your health care provider.  Manage any chronic conditions, such as high blood pressure and diabetes, as told by your health care provider. This is important.  How do I know that I am pregnant? You may be pregnant if you have been sexually active and you miss your period. Symptoms of early pregnancy include:  Mild cramping.  Very light vaginal bleeding (spotting).  Feeling unusually tired.  Nausea and vomiting (morning sickness).  If you have any of these symptoms and you suspect that you might be pregnant, you can take a home pregnancy test. These tests check for a hormone in your urine (human chorionic gonadotropin, or hCG). A woman's body begins to make this hormone during early pregnancy. These tests are very accurate. Wait until at least the first day after you miss your period to take one. If the test shows that you are pregnant (you get a positive result), call your health care provider to make an appointment for prenatal care. What should I do if I become pregnant?  Make an appointment with your health care provider as soon as you suspect you are pregnant.  Do not use any products that contain nicotine, such as cigarettes, chewing tobacco, and e-cigarettes. If you need help quitting, ask your health care provider.  Do not drink alcoholic beverages. Alcohol is related to a number of birth defects.  Avoid toxic odors and chemicals.  You may continue to have sexual intercourse if it does not cause pain or other problems, such as vaginal bleeding. This information is not intended to replace advice given to you by your health care provider. Make sure you discuss any questions you have with your health care provider. Document Released: 10/25/2008 Document Revised: 07/10/2016 Document Reviewed: 06/03/2016 Elsevier Interactive Patient Education  2018 Elsevier Inc.  

## 2018-05-06 NOTE — Progress Notes (Signed)
GYNECOLOGY PROGRESS NOTE  Subjective:    Patient ID: Jessica Santana, female    DOB: 02/02/1986, 32 y.o.   MRN: [id number]  HPI  Patient is a [age] y.o. G0P0000 female who presents today with several concerns:  1. Patient has questions regarding her current medications and fertility.  Notes that she is not yet ready to begin attempts at conceiving, but will be within a year or so. Is curious about which of her medications she would need to discontinue prior to pregnancy.  2. Patient complains of vaginal discharge. Denies odor, but does note occasional mild itching.  She does have a history of recurrent vaginitis.  3. Patient reports having irregular cycles.  Notes that her cycles were usually ~ 35-40 days, however approximately 2 months ago her cycle stretched to almost 60 days.  Notes that she had an ultrasound in [place] while she was there for a mission trip due to absent menses and the onset of pelvic pain (patient has an IUD in place as well), and she was told that she had several ovarian cysts and that her IUD was in place.     The following portions of the patient's history were reviewed and updated as appropriate: allergies, current medications, past family history, past medical history, past social history, past surgical history and problem list.  Review of Systems Pertinent items noted in HPI and remainder of comprehensive ROS otherwise negative.   Objective:   Blood pressure 98/63, pulse 94, height 5\' 3"  (1.6 m), weight 141 lb 14.4 oz (64.4 kg), last menstrual period 04/07/2018. General appearance: alert and no distress Abdomen: soft, non-tender; bowel sounds normal; no masses,  no organomegaly  Pelvis: external genitalia normal, rectovaginal septum normal.  Vagina with small amount of thin white discharge.  Cervix normal appearing, no lesions and no motion tenderness.  Uterus mobile, nontender, normal shape and size.  Adnexae non-palpable, nontender bilaterally.  Remainder of exam  deferred.    Microscopic wet-mount exam shows negative for pathogens, normal epithelial cells.  Assessment:   Pre-conception counseling Irregular menses Vaginal discharge H/o ovarian cyst  Plan:   1.Pre-conception counseling - discussion had on patient's current medications. Notes that she was recently started on a new FDA approved medication for her migraines (Aimovig). Discussed that as this was a relatively new medication, there would not likely be much information regarding safety in pregnancy and would probably need to be discontinued prior to conception, as well as her the majority of her other migraine medications (with the exception of her Keppra and Cymbalta). To discuss further with her Neurologist regarding other alternatives when she is ready to conceive.  2. Irregular menses - patient notes it may have been stress related as her periods have since returned to it's normal schedule.  3. Vaginal discharge - no evidence of infection on today's exam or wet prep.  Discussed hygiene techniques and OTC remedies.  4. H/o ovarian cysts - noted on ultrasound performed in [place] in February. Reviewed ultrasound (although in Spanish), with several small cysts, involving both ovaries.  Also with IUD in place. Recommend repeat ultrasound to f/u cysts, and to evaluate for PCOS in light of patient's menstrual irregularities and findings on prior ultrasound. Patient notes she is leaving next week to return to [place] for several more months. Will have this performed prior to her departure.    A total of 25 minutes were spent face-to-face with the patient during this encounter and over half of that time involved  counseling, review of records, and coordination of care.   Hildred Laser, MD [facility]

## 2018-05-08 ENCOUNTER — Other Ambulatory Visit: Payer: Self-pay | Admitting: Obstetrics and Gynecology

## 2018-05-08 DIAGNOSIS — N83209 Unspecified ovarian cyst, unspecified side: Secondary | ICD-10-CM

## 2018-05-11 ENCOUNTER — Encounter: Payer: Self-pay | Admitting: Obstetrics and Gynecology

## 2018-05-12 ENCOUNTER — Ambulatory Visit (INDEPENDENT_AMBULATORY_CARE_PROVIDER_SITE_OTHER): Payer: BLUE CROSS/BLUE SHIELD

## 2018-05-12 ENCOUNTER — Other Ambulatory Visit: Payer: Self-pay | Admitting: Family Medicine

## 2018-05-12 DIAGNOSIS — N83209 Unspecified ovarian cyst, unspecified side: Secondary | ICD-10-CM

## 2018-05-13 ENCOUNTER — Other Ambulatory Visit: Payer: Self-pay | Admitting: Family Medicine

## 2018-05-13 MED ORDER — DULOXETINE HCL 60 MG PO CPEP: 60.0000 mg | ORAL_CAPSULE | Freq: Every day | ORAL | 3 refills | Status: DC

## 2018-05-13 NOTE — Telephone Encounter (Signed)
[facility] pharmacy faxed a refill request for the following medication. Thanks CC  DULoxetine (CYMBALTA) 60 MG capsule

## 2018-05-13 NOTE — Telephone Encounter (Signed)
Pharmacy requesting refills. Thanks!  

## 2018-06-23 NOTE — Progress Notes (Signed)
To Whom It May Concern,    I made a house call on Jessica Santana on 05/12/2018.  She had flown in from [place] a couple of weeks before and had severe ear pain.  Pain was to the point they almost diverted the plane because of her pain level.  She was seen in urgent care and found to have a ruptured Tympanic membrane/otitis media.  Her ear continues to be full and painful. On exam I do not see a rupture in the tympanic membrane but both TMs are still very full/bulging .  She has been treated to this point with 2 different antibiotics and now a full dose of prednisone and is still no better.  She would be risk of severe pain and rupturing her TMs if she tried to fly now.  There is no way that she can safely fly on May 14, 2018.  I have talked to the  local ear nose and throat doctor and they completely agree.  Her flight is put off for about a week.   Sincerely,  Julieanne Mansonichard Mariane Burpee M.D. [facility]

## 2018-07-17 ENCOUNTER — Other Ambulatory Visit: Payer: Self-pay | Admitting: Family Medicine

## 2018-11-13 DIAGNOSIS — G43719 Chronic migraine without aura, intractable, without status migrainosus: Secondary | ICD-10-CM | POA: Diagnosis not present

## 2018-11-13 DIAGNOSIS — M791 Myalgia, unspecified site: Secondary | ICD-10-CM | POA: Diagnosis not present

## 2018-11-13 DIAGNOSIS — G43019 Migraine without aura, intractable, without status migrainosus: Secondary | ICD-10-CM | POA: Diagnosis not present

## 2018-11-13 DIAGNOSIS — M542 Cervicalgia: Secondary | ICD-10-CM | POA: Diagnosis not present

## 2018-11-13 DIAGNOSIS — G518 Other disorders of facial nerve: Secondary | ICD-10-CM | POA: Diagnosis not present

## 2018-11-15 ENCOUNTER — Other Ambulatory Visit: Payer: Self-pay | Admitting: Family Medicine

## 2019-06-16 DIAGNOSIS — G43019 Migraine without aura, intractable, without status migrainosus: Secondary | ICD-10-CM | POA: Diagnosis not present

## 2019-06-16 DIAGNOSIS — G43719 Chronic migraine without aura, intractable, without status migrainosus: Secondary | ICD-10-CM | POA: Diagnosis not present

## 2019-06-17 ENCOUNTER — Other Ambulatory Visit: Payer: Self-pay | Admitting: Family Medicine

## 2019-06-23 ENCOUNTER — Other Ambulatory Visit: Payer: Self-pay

## 2019-06-23 DIAGNOSIS — G43019 Migraine without aura, intractable, without status migrainosus: Secondary | ICD-10-CM | POA: Diagnosis not present

## 2019-06-23 DIAGNOSIS — Z1159 Encounter for screening for other viral diseases: Secondary | ICD-10-CM

## 2019-06-23 DIAGNOSIS — G43719 Chronic migraine without aura, intractable, without status migrainosus: Secondary | ICD-10-CM | POA: Diagnosis not present

## 2019-06-23 DIAGNOSIS — G518 Other disorders of facial nerve: Secondary | ICD-10-CM | POA: Diagnosis not present

## 2019-06-23 DIAGNOSIS — M791 Myalgia, unspecified site: Secondary | ICD-10-CM | POA: Diagnosis not present

## 2019-06-23 DIAGNOSIS — G43709 Chronic migraine without aura, not intractable, without status migrainosus: Secondary | ICD-10-CM | POA: Diagnosis not present

## 2019-06-23 DIAGNOSIS — M542 Cervicalgia: Secondary | ICD-10-CM | POA: Diagnosis not present

## 2019-06-24 ENCOUNTER — Other Ambulatory Visit: Payer: Self-pay

## 2019-06-24 DIAGNOSIS — R6889 Other general symptoms and signs: Secondary | ICD-10-CM | POA: Diagnosis not present

## 2019-06-24 DIAGNOSIS — Z20822 Contact with and (suspected) exposure to covid-19: Secondary | ICD-10-CM

## 2019-06-25 DIAGNOSIS — M542 Cervicalgia: Secondary | ICD-10-CM | POA: Diagnosis not present

## 2019-06-25 DIAGNOSIS — G43719 Chronic migraine without aura, intractable, without status migrainosus: Secondary | ICD-10-CM | POA: Diagnosis not present

## 2019-06-25 DIAGNOSIS — G43019 Migraine without aura, intractable, without status migrainosus: Secondary | ICD-10-CM | POA: Diagnosis not present

## 2019-06-25 DIAGNOSIS — G518 Other disorders of facial nerve: Secondary | ICD-10-CM | POA: Diagnosis not present

## 2019-06-25 DIAGNOSIS — M791 Myalgia, unspecified site: Secondary | ICD-10-CM | POA: Diagnosis not present

## 2019-06-26 LAB — NOVEL CORONAVIRUS, NAA: SARS-CoV-2, NAA: NOT DETECTED

## 2019-10-06 DIAGNOSIS — G43709 Chronic migraine without aura, not intractable, without status migrainosus: Secondary | ICD-10-CM | POA: Diagnosis not present

## 2019-10-06 DIAGNOSIS — G518 Other disorders of facial nerve: Secondary | ICD-10-CM | POA: Diagnosis not present

## 2019-10-06 DIAGNOSIS — G43719 Chronic migraine without aura, intractable, without status migrainosus: Secondary | ICD-10-CM | POA: Diagnosis not present

## 2019-10-06 DIAGNOSIS — M542 Cervicalgia: Secondary | ICD-10-CM | POA: Diagnosis not present

## 2019-10-06 DIAGNOSIS — G43019 Migraine without aura, intractable, without status migrainosus: Secondary | ICD-10-CM | POA: Diagnosis not present

## 2019-10-06 DIAGNOSIS — M791 Myalgia, unspecified site: Secondary | ICD-10-CM | POA: Diagnosis not present

## 2019-11-18 DIAGNOSIS — G43019 Migraine without aura, intractable, without status migrainosus: Secondary | ICD-10-CM | POA: Diagnosis not present

## 2019-11-18 DIAGNOSIS — M791 Myalgia, unspecified site: Secondary | ICD-10-CM | POA: Diagnosis not present

## 2019-11-18 DIAGNOSIS — G518 Other disorders of facial nerve: Secondary | ICD-10-CM | POA: Diagnosis not present

## 2019-11-18 DIAGNOSIS — M542 Cervicalgia: Secondary | ICD-10-CM | POA: Diagnosis not present

## 2019-11-18 DIAGNOSIS — G43709 Chronic migraine without aura, not intractable, without status migrainosus: Secondary | ICD-10-CM | POA: Diagnosis not present

## 2019-12-08 ENCOUNTER — Other Ambulatory Visit: Payer: Self-pay | Admitting: Family Medicine

## 2019-12-09 ENCOUNTER — Ambulatory Visit: Payer: BC Managed Care – PPO | Admitting: Obstetrics and Gynecology

## 2019-12-09 ENCOUNTER — Other Ambulatory Visit: Payer: Self-pay

## 2019-12-09 ENCOUNTER — Encounter: Payer: Self-pay | Admitting: Obstetrics and Gynecology

## 2019-12-09 VITALS — BP 109/74 | HR 87 | Ht 63.0 in | Wt 144.7 lb

## 2019-12-09 DIAGNOSIS — N760 Acute vaginitis: Secondary | ICD-10-CM | POA: Diagnosis not present

## 2019-12-09 DIAGNOSIS — B9689 Other specified bacterial agents as the cause of diseases classified elsewhere: Secondary | ICD-10-CM

## 2019-12-09 DIAGNOSIS — G43909 Migraine, unspecified, not intractable, without status migrainosus: Secondary | ICD-10-CM

## 2019-12-09 MED ORDER — PROGESTERONE MICRONIZED 200 MG PO CAPS: 200.0000 mg | ORAL_CAPSULE | Freq: Every day | ORAL | 3 refills | Status: AC

## 2019-12-09 MED ORDER — SOLOSEC 2 G PO PACK: 1.0000 | PACK | Freq: Once | ORAL | 0 refills | Status: DC

## 2019-12-09 NOTE — Progress Notes (Signed)
    GYNECOLOGY PROGRESS NOTE  Subjective:    Patient ID: Jessica Santana, female    DOB: [birth year], 34 y.o.   MRN: [id number]  HPI  Patient is a 34 y.o. G0P0000 female who presents for complaints of yellowish discharge and painful intercourse for the past several months, but has been more noticeable this month. Has ParaGard IUD in place for contraception.  Denies any concerns for STDs at this time.   Also noticed that her migraines get worse towards the end of the cycle. Is currently getting injections with Neurology (Amiovig, Botox) and also using Keppra.  Migraine frequency overall has decreased.   Patient also has concerns regarding her medication use and if she could get pregnant. Notes her Neurologist told her she would need to come off of all of her medications for 6 for months before attempting to conceive, which was disheartening for her as she felt that she would have to suffer with her migraines for at least 6 months before even being able to attempt pregnancy.    The following portions of the patient's history were reviewed and updated as appropriate: allergies, current medications, past family history, past medical history, past social history, past surgical history and problem list.   Review of Systems Pertinent items noted in HPI and remainder of comprehensive ROS otherwise negative.   Objective:   Blood pressure 109/74, pulse 87, height 5\' 3"  (1.6 m), weight 144 lb 11.2 oz (65.6 kg). General appearance: alert and no distress Abdomen: soft, non-tender; bowel sounds normal; no masses,  no organomegaly.  Pelvic: external genitalia normal, rectovaginal septum normal.  Vagina with scant thin white discharge.  Cervix normal appearing, no lesions and no motion tenderness.  Uterus mobile, nontender, normal shape and size.  Adnexae non-palpable, nontender bilaterally.    Labs:  Microscopic wet-mount exam shows numerous clue cells, negative for hyphae, no trichomonads, no white  blood cells. KOH done.    Assessment:   Bacterial vaginosis Migraine without status migrainosus, not intractable, unspecified migraine type  Plan:   1. Bacterial vaginosis noted on wet prep.  Will prescribe Flagyl.  Patient desires STD screening (declines serology screen). Nuswab performed today.  2. History of migraines - lengthy discussion had regarding current medications and pre-conception planning. Discussed that with the majority of her medications she could continue use (such as Cymbalta, Keppra) during a pregnancy if benefits outweighed risks.  She would need to discontinue the Amiovig, but would only need approximately 1-2 months before attempting conception, not 6. Patient grateful for information.  Also discussed different options for management of her migraines at the end of her cycle, including changing to a progesterone-only method (pills, IUD, Nexplanon), or use of progesterone at the start of her cycle. Will try progesterone tablets. Prescribe Prometrium for use during menstrual cycle to ease post-cycle migraine symptoms. 3. To f/u as needed or if symptoms persist/worsen.    A total of 15 minutes were spent face-to-face with the patient during this encounter and over half of that time dealt with counseling and coordination of care.   , MD [facility]

## 2019-12-09 NOTE — Progress Notes (Signed)
Pt for infection check. Pt stated having pain with sex, discharge and increased in migraine after her cycle. Pt think she may have an infection(bacterical) due to the sx that she is having.

## 2019-12-10 ENCOUNTER — Other Ambulatory Visit: Payer: Self-pay

## 2019-12-10 ENCOUNTER — Encounter: Payer: Self-pay | Admitting: Obstetrics and Gynecology

## 2019-12-10 ENCOUNTER — Telehealth: Payer: Self-pay

## 2019-12-10 MED ORDER — METRONIDAZOLE 500 MG PO TABS: 500.0000 mg | ORAL_TABLET | Freq: Two times a day (BID) | ORAL | 0 refills | Status: AC

## 2019-12-10 MED ORDER — FLUCONAZOLE 150 MG PO TABS: 150.0000 mg | ORAL_TABLET | Freq: Once | ORAL | 0 refills | Status: AC

## 2019-12-10 MED ORDER — SOLOSEC 2 G PO PACK: 2.0000 g | PACK | Freq: Once | ORAL | 0 refills | Status: AC

## 2019-12-10 NOTE — Telephone Encounter (Signed)
The prescription that was provided to patient isn't covered through our insurance. Pls call patient with advice.

## 2019-12-10 NOTE — Telephone Encounter (Signed)
Spoke with pt to inform her that I sent in flagyl and diflucan to help treat her BV since Solosec was not covered by her insurance and unable to use discount card.

## 2019-12-10 NOTE — Telephone Encounter (Signed)
Pt is calling back to let the nurse know, [facility] still will not approve the medication. Please call patient for instructions. Patient asked that the new medication through [facility].

## 2019-12-11 ENCOUNTER — Ambulatory Visit: Payer: BC Managed Care – PPO | Attending: Internal Medicine

## 2019-12-11 DIAGNOSIS — Z20822 Contact with and (suspected) exposure to covid-19: Secondary | ICD-10-CM | POA: Diagnosis not present

## 2019-12-11 NOTE — Telephone Encounter (Signed)
Spoke with pt yesterday and sent in Flagyl and Diflucan to treat BV.

## 2019-12-12 LAB — NOVEL CORONAVIRUS, NAA: SARS-CoV-2, NAA: NOT DETECTED

## 2020-05-25 ENCOUNTER — Other Ambulatory Visit: Payer: Self-pay | Admitting: Family Medicine

## 2020-05-25 NOTE — Telephone Encounter (Signed)
Refill requests for Duloxetine and Pantoprazole, both refilled 12/08/19; no valid encounter within last 12 months; last seen in office 03/13/16; attempted to contact pt discuss; left message on voicemail; will route to office for final disposition.  Requested medication (s) are due for refill today:  Duloxetine & Pantoprazole, no  Requested medication (s) are on the active medication list: yes  Last refill:  12/08/19  Future visit scheduled: no  Notes to clinic: no valid encounter in last 12 months

## 2020-06-02 ENCOUNTER — Telehealth (INDEPENDENT_AMBULATORY_CARE_PROVIDER_SITE_OTHER): Payer: BC Managed Care – PPO | Admitting: Family Medicine

## 2020-06-02 DIAGNOSIS — K219 Gastro-esophageal reflux disease without esophagitis: Secondary | ICD-10-CM | POA: Diagnosis not present

## 2020-06-02 DIAGNOSIS — K589 Irritable bowel syndrome without diarrhea: Secondary | ICD-10-CM

## 2020-06-02 DIAGNOSIS — G43909 Migraine, unspecified, not intractable, without status migrainosus: Secondary | ICD-10-CM

## 2020-06-02 DIAGNOSIS — B354 Tinea corporis: Secondary | ICD-10-CM | POA: Diagnosis not present

## 2020-06-02 DIAGNOSIS — J309 Allergic rhinitis, unspecified: Secondary | ICD-10-CM

## 2020-06-02 NOTE — Progress Notes (Signed)
MyChart Video Visit    Virtual Visit via Video Note   This visit type was conducted due to national recommendations for restrictions regarding the COVID-19 Pandemic (e.g. social distancing) in an effort to limit this patient's exposure and mitigate transmission in our community. This patient is at least at moderate risk for complications without adequate follow up. This format is felt to be most appropriate for this patient at this time. Physical exam was limited by quality of the video and audio technology used for the visit.   Patient location:  Home in [place] Provider location: Office   Patient: Jessica Santana   DOB: [birth year]   34 y.o. Female  MRN: [id number] Visit Date: 06/02/2020  Today's healthcare provider: Megan Mans, MD   No chief complaint on file.  Subjective    HPI  Patient was contacted by [facility] to make an appointment as she has not been in in a while.  She and her husband live in [place] in [place].  Video did not work so this is a Emergency planning/management officer.  She did send me a picture of a rash which looks like ringworm.  It is winter in [place] and he has to be 80 degrees at the end the day and 30 degrees at night as she lives at [place].  She has some dry eyes. The last thing is 1 of chronic migraine headaches.  She has triptans and other medications available but combination of Tylenol plus caffeine plus ergotamine is available in [place] over-the-counter.  I told her it should her age she can take that about once a month for her migraines. She is stressed out as she and her husband are in the process of trying to adopt a teenage daughter.  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    Medications: Outpatient Medications Prior to Visit  Medication Sig  . DULoxetine (CYMBALTA) 60 MG capsule TAKE 1 CAPSULE BY MOUTH EVERY DAY  . EPIPEN 2-PAK 0.3 MG/0.3ML SOAJ injection INJECT INTRAMUSCULARLY IN THE EVENT OF AN EMERGENT ALLERGIC REACTION WITH DIFFICULTY  BREATHING  . Erenumab-aooe (AIMOVIG) 70 MG/ML SOAJ Inject 140 mg into the skin.   Marland Kitchen levETIRAcetam (KEPPRA) 1000 MG tablet Take 1,000 mg by mouth 2 (two) times daily.  Marland Kitchen levocetirizine (XYZAL) 5 MG tablet TAKE ONE TABLET BY MOUTH EVERY DAY  . metoCLOPramide (REGLAN) 10 MG tablet Take 20 mg by mouth every 6 (six) hours as needed.   . metroNIDAZOLE (FLAGYL) 500 MG tablet Take 1 tablet (500 mg total) by mouth 2 (two) times daily.  . naratriptan (AMERGE) 2.5 MG tablet   . OnabotulinumtoxinA (BOTOX IJ) Inject as directed.  . pantoprazole (PROTONIX) 40 MG tablet TAKE 1 TABLET BY MOUTH EVERY MORNING  . PARAGARD INTRAUTERINE COPPER IU by Intrauterine route.  Marland Kitchen perphenazine (TRILAFON) 4 MG tablet as needed.   Marland Kitchen PROAIR HFA 108 (90 Base) MCG/ACT inhaler INHALE 2 PUFFS EVERY 4 HOURS AS NEEDED  . progesterone (PROMETRIUM) 200 MG capsule Take 1 capsule (200 mg total) by mouth daily. Use on days of bleeding with menstrual cycle.  . Secnidazole (SOLOSEC) 2 g PACK Take 2 g by mouth once for 1 dose.  Marland Kitchen tiZANidine (ZANAFLEX) 4 MG capsule Take by mouth as needed.    No facility-administered medications prior to visit.    Review of Systems     Objective    There were no vitals taken for this visit.    Physical Exam     Assessment &  Plan     1. Migraine without status migrainosus, not intractable, unspecified migraine type She can drive a Tylenol/caffeine/ergotamine about once a month for headaches.  2. 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  4. Tinea corporis The topical agents for treatment are available over-the-counter in [place].  We discussed these.  5. Allergic rhinitis, unspecified seasonality, unspecified trigger She is describing dry eyes which may need a visit to the eye doctor.  Now would use moisturizing drops and possibly Visine once a week.   No follow-ups on file.     I discussed the  assessment and treatment plan with the patient. The patient was provided an opportunity to ask questions and all were answered. The patient agreed with the plan and demonstrated an understanding of the instructions.   The patient was advised to call back or seek an in-person evaluation if the symptoms worsen or if the condition fails to improve as anticipated.  I provided 11 minutes of non-face-to-face time during this encounter.  I, Megan Mans, MD, have reviewed all documentation for this visit. The documentation on 06/02/20 for the exam, diagnosis, procedures, and orders are all accurate and complete.   Amirah Goerke Wendelyn Breslow, MD [facility] [phone] (phone) [phone] (fax)  [facility]

## 2020-12-05 ENCOUNTER — Telehealth (INDEPENDENT_AMBULATORY_CARE_PROVIDER_SITE_OTHER): Payer: BC Managed Care – PPO | Admitting: Family Medicine

## 2020-12-05 DIAGNOSIS — J309 Allergic rhinitis, unspecified: Secondary | ICD-10-CM | POA: Diagnosis not present

## 2020-12-05 DIAGNOSIS — K219 Gastro-esophageal reflux disease without esophagitis: Secondary | ICD-10-CM | POA: Diagnosis not present

## 2020-12-05 DIAGNOSIS — G43909 Migraine, unspecified, not intractable, without status migrainosus: Secondary | ICD-10-CM | POA: Diagnosis not present

## 2020-12-05 DIAGNOSIS — K589 Irritable bowel syndrome without diarrhea: Secondary | ICD-10-CM

## 2020-12-05 MED ORDER — DULOXETINE HCL 60 MG PO CPEP: 60.0000 mg | ORAL_CAPSULE | Freq: Every day | ORAL | 0 refills | Status: DC

## 2020-12-05 MED ORDER — DEXLANSOPRAZOLE 60 MG PO CPDR: 60.0000 mg | DELAYED_RELEASE_CAPSULE | Freq: Every day | ORAL | 12 refills | Status: DC

## 2020-12-05 MED ORDER — LEVOCETIRIZINE DIHYDROCHLORIDE 5 MG PO TABS: 5.0000 mg | ORAL_TABLET | Freq: Every day | ORAL | 0 refills | Status: DC

## 2020-12-05 NOTE — Progress Notes (Signed)
MyChart Video Visit    Virtual Visit via Video Note   This visit type was conducted due to national recommendations for restrictions regarding the COVID-19 Pandemic (e.g. social distancing) in an effort to limit this patient's exposure and mitigate transmission in our community. This patient is at least at moderate risk for complications without adequate follow up. This format is felt to be most appropriate for this patient at this time. Physical exam was limited by quality of the video and audio technology used for the visit.   Patient location: Home Provider location: Office  I discussed the limitations of evaluation and management by telemedicine and the availability of in person appointments. The patient expressed understanding and agreed to proceed. This ended up being an FaceTime visit as I was unable to connect on the computer with patient.  She suggested and was comfortable with FaceTime. Patient: Jessica Santana   DOB: [birth year]   35 y.o. Female  MRN: [id number] Visit Date: 12/05/2020  Today's healthcare provider: Megan Mans, MD   Chief Complaint  Patient presents with  . Gastroesophageal Reflux   Subjective    Gastroesophageal Reflux She complains of dysphagia, heartburn and nausea. She reports no abdominal pain, no choking, no coughing, no early satiety or no sore throat. This is a chronic problem. The problem has been gradually worsening (Despite increasing Protonix to twice a day.). The symptoms are aggravated by lying down. She has tried a PPI for the symptoms. The treatment provided no relief.     She states in the past for her reflux Dexilant worked.  As of a couple years ago was not covered on her insurance.  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     Tested positive COVID 11/28/2020.  Despite testing positive she is asymptomatic.  She had a direct exposure.  She is fully vaccinated and [place].  She is having problems with her migraines and would like to find a migraine/headache specialist that can see her via virtual visits as she lives now in [place] full-time.  She and her husband recently adopted a 21 year old daughter. Patient Active Problem List   Diagnosis Date Noted  . Allergic rhinitis 03/05/2016  . Asthma, exercise induced 03/05/2016  . Acid reflux 03/05/2016  . 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 Headache, migraine 03/05/2016  . Cyst of ovary 03/05/2016   Past Medical History:  Diagnosis Date  . Migraine    Social History   Tobacco Use  . Smoking status: Never Smoker  . Smokeless tobacco: Never Used  Vaping Use  . Vaping Use: Never used  Substance Use Topics  . Alcohol use: No  . Drug use: No   Allergies  Allergen Reactions  . Lamictal [Lamotrigine] Anaphylaxis  . Barley Grass   . Clarithromycin     nausea and GI  . Lac Bovis Other (See Comments)    Pt. sts she drinks Milk  . Pollen Extract   . Shrimp Flavor     Medications: Outpatient Medications Prior to Visit  Medication Sig  . DULoxetine (CYMBALTA) 60 MG capsule TAKE 1 CAPSULE BY MOUTH EVERY DAY  . EPIPEN 2-PAK 0.3 MG/0.3ML SOAJ injection INJECT INTRAMUSCULARLY IN THE EVENT OF AN EMERGENT ALLERGIC REACTION WITH DIFFICULTY BREATHING  . Erenumab-aooe (AIMOVIG) 70 MG/ML SOAJ Inject 140 mg into the skin.   Marland Kitchen levETIRAcetam (KEPPRA) 1000 MG tablet Take 1,000 mg by mouth 2 (two) times daily.  Marland Kitchen  levocetirizine (XYZAL) 5 MG tablet TAKE ONE TABLET BY MOUTH EVERY DAY  . metoCLOPramide (REGLAN) 10 MG tablet Take 20 mg by mouth every 6 (six) hours as needed.   . metroNIDAZOLE (FLAGYL) 500 MG tablet Take 1 tablet (500 mg total) by mouth 2 (two) times daily.  . naratriptan (AMERGE) 2.5 MG tablet   . OnabotulinumtoxinA (BOTOX IJ) Inject as directed.  . pantoprazole (PROTONIX) 40 MG tablet TAKE 1 TABLET BY MOUTH EVERY MORNING  . PARAGARD INTRAUTERINE COPPER IU by Intrauterine route.  Marland Kitchen perphenazine  (TRILAFON) 4 MG tablet as needed.   Marland Kitchen PROAIR HFA 108 (90 Base) MCG/ACT inhaler INHALE 2 PUFFS EVERY 4 HOURS AS NEEDED  . progesterone (PROMETRIUM) 200 MG capsule Take 1 capsule (200 mg total) by mouth daily. Use on days of bleeding with menstrual cycle.  . Secnidazole (SOLOSEC) 2 g PACK Take 2 g by mouth once for 1 dose.  Marland Kitchen tiZANidine (ZANAFLEX) 4 MG capsule Take by mouth as needed.    No facility-administered medications prior to visit.    Review of Systems  Constitutional: Negative.   HENT: Negative for sore throat.   Respiratory: Negative.  Negative for cough and choking.   Cardiovascular: Negative.   Gastrointestinal: Positive for dysphagia, heartburn and nausea. Negative for abdominal distention, abdominal pain, anal bleeding, blood in stool, constipation, diarrhea, rectal pain and vomiting.  Neurological: Negative for dizziness, light-headedness and headaches.      Objective    There were no vitals taken for this visit.   Physical Exam  She appears in no distress.  She breathing easily without wheezing.  She is alert and oriented.   Assessment & Plan     1. Gastroesophageal reflux disease, unspecified whether esophagitis present Try Dexilant.  She has failed twice daily Protonix.  2. Migraine without status migrainosus, not intractable, unspecified migraine type Try to find headache specialist who can see her virtually.  3. Allergic rhinitis, unspecified seasonality, unspecified trigger Refill Xyzal.  4. Adaptive colitis Also per GI but clinically stable   No follow-ups on file.     I discussed the assessment and treatment plan with the patient. The patient was provided an opportunity to ask questions and all were answered. The patient agreed with the plan and demonstrated an understanding of the instructions.   The patient was advised to call back or seek an in-person evaluation if the symptoms worsen or if the condition fails to improve as anticipated.  I  provided 13 minutes of non-face-to-face time during this encounter.  I, Megan Mans, MD, have reviewed all documentation for this visit. The documentation on 12/05/20 for the exam, diagnosis, procedures, and orders are all accurate and complete.   Christianne Zacher Wendelyn Breslow, MD [facility] [phone] (phone) [phone] (fax)  [facility]

## 2020-12-14 DIAGNOSIS — G43709 Chronic migraine without aura, not intractable, without status migrainosus: Secondary | ICD-10-CM | POA: Diagnosis not present

## 2020-12-14 DIAGNOSIS — G43019 Migraine without aura, intractable, without status migrainosus: Secondary | ICD-10-CM | POA: Diagnosis not present

## 2020-12-18 ENCOUNTER — Other Ambulatory Visit: Payer: Self-pay | Admitting: Family Medicine

## 2020-12-18 DIAGNOSIS — K219 Gastro-esophageal reflux disease without esophagitis: Secondary | ICD-10-CM

## 2020-12-18 MED ORDER — PANTOPRAZOLE SODIUM 40 MG PO TBEC: 40.0000 mg | DELAYED_RELEASE_TABLET | Freq: Every day | ORAL | 4 refills | Status: AC

## 2021-09-04 ENCOUNTER — Other Ambulatory Visit: Payer: Self-pay | Admitting: Family Medicine

## 2021-11-28 ENCOUNTER — Other Ambulatory Visit: Payer: Self-pay

## 2021-11-28 MED ORDER — DEXLANSOPRAZOLE 60 MG PO CPDR: 60.0000 mg | DELAYED_RELEASE_CAPSULE | Freq: Every day | ORAL | 1 refills | Status: AC

## 2022-07-16 ENCOUNTER — Other Ambulatory Visit: Payer: Self-pay | Admitting: *Deleted

## 2022-07-16 DIAGNOSIS — J309 Allergic rhinitis, unspecified: Secondary | ICD-10-CM

## 2022-07-16 MED ORDER — ALBUTEROL SULFATE HFA 108 (90 BASE) MCG/ACT IN AERS: 2.0000 | INHALATION_SPRAY | RESPIRATORY_TRACT | 11 refills | Status: AC | PRN
